# Patient Record
Sex: Female | Born: 1970 | Race: Black or African American | Hispanic: No | Marital: Married | Smoking: Current every day smoker
Health system: Southern US, Community
[De-identification: ages and names within clinical notes are randomized; demographics above are authoritative.]

## PROBLEM LIST (undated history)

## (undated) DIAGNOSIS — G4730 Sleep apnea, unspecified: Secondary | ICD-10-CM

## (undated) DIAGNOSIS — I10 Essential (primary) hypertension: Secondary | ICD-10-CM

## (undated) DIAGNOSIS — E785 Hyperlipidemia, unspecified: Secondary | ICD-10-CM

## (undated) DIAGNOSIS — Z8489 Family history of other specified conditions: Secondary | ICD-10-CM

## (undated) HISTORY — DX: Essential (primary) hypertension: I10

## (undated) HISTORY — DX: Hyperlipidemia, unspecified: E78.5

## (undated) HISTORY — PX: OTHER SURGICAL HISTORY: SHX169

## (undated) HISTORY — PX: TUBAL LIGATION: SHX77

---
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
## 2008-09-07 LAB — CONVERTED CEMR LAB: Pap Smear: NORMAL

## 2010-01-15 ENCOUNTER — Ambulatory Visit: Payer: Self-pay | Admitting: Family Medicine

## 2010-01-15 DIAGNOSIS — I10 Essential (primary) hypertension: Secondary | ICD-10-CM | POA: Insufficient documentation

## 2010-01-16 LAB — CONVERTED CEMR LAB
ALT: 10 units/L (ref 0–35)
Albumin: 4.1 g/dL (ref 3.5–5.2)
CO2: 22 meq/L (ref 19–32)
Calcium: 9 mg/dL (ref 8.4–10.5)
Chloride: 108 meq/L (ref 96–112)
Cholesterol: 171 mg/dL (ref 0–200)
Glucose, Bld: 92 mg/dL (ref 70–99)
Sodium: 139 meq/L (ref 135–145)
Total Bilirubin: 0.6 mg/dL (ref 0.3–1.2)
Total Protein: 6.2 g/dL (ref 6.0–8.3)
Triglycerides: 103 mg/dL (ref <150)

## 2010-01-19 ENCOUNTER — Encounter: Payer: Self-pay | Admitting: Family Medicine

## 2010-01-27 ENCOUNTER — Ambulatory Visit: Payer: Self-pay | Admitting: Family Medicine

## 2010-05-15 ENCOUNTER — Encounter: Payer: Self-pay | Admitting: Emergency Medicine

## 2010-05-15 ENCOUNTER — Ambulatory Visit
Admission: RE | Admit: 2010-05-15 | Discharge: 2010-05-15 | Payer: Self-pay | Source: Home / Self Care | Admitting: Emergency Medicine

## 2010-05-15 DIAGNOSIS — I10 Essential (primary) hypertension: Secondary | ICD-10-CM | POA: Insufficient documentation

## 2010-05-19 ENCOUNTER — Telehealth (INDEPENDENT_AMBULATORY_CARE_PROVIDER_SITE_OTHER): Payer: Self-pay | Admitting: *Deleted

## 2010-05-25 ENCOUNTER — Ambulatory Visit
Admission: RE | Admit: 2010-05-25 | Discharge: 2010-05-25 | Payer: Self-pay | Source: Home / Self Care | Attending: Family Medicine | Admitting: Family Medicine

## 2010-06-09 NOTE — Assessment & Plan Note (Signed)
Summary: check BP  Nurse Visit   Vital Signs:  Patient profile:   40 year old female Pulse rate:   70 / minute BP sitting:   137 / 88  (right arm) Cuff size:   large  History of Present Illness: BP check. BP was high at last visit.  Pt has form that needs to be mailed to [facility]. Tolerating medication well.    Impression & Recommendations:  Problem # 1:  HYPERTENSION, BENIGN (ICD-401.1) Much better. F/U in 3 months. Will send forms to the [facility].   Her updated medication list for this problem includes:    Lisinopril-hydrochlorothiazide 20-12.5 Mg Tabs (Lisinopril-hydrochlorothiazide) ..... One tablet by mouth once a day  Complete Medication List: 1)  Lisinopril-hydrochlorothiazide 20-12.5 Mg Tabs (Lisinopril-hydrochlorothiazide) .... One tablet by mouth once a day   Allergies: No Known Drug Allergies  Orders Added: 1)  Est. Patient Level I [[address]]

## 2010-06-09 NOTE — Letter (Signed)
Summary: Medical Report Form/NCDMV  Medical Report Form/NCDMV   Imported By: Lanelle Bal 02/03/2010 11:52:42  _____________________________________________________________________  External Attachment:    Type:   Image     Comment:   External Document

## 2010-06-09 NOTE — Letter (Signed)
Summary: Generic Letter  [facility]  [address], [address]   [place], [place] [address]   Phone: [phone]  Fax: [phone]    01/19/2010  Kimberly Gamble [address] [place], [place]  [address]  Dear Ms. Hippler,    We have received your lab results back and have been unable to reach you. Labs look good. Your LDL (bad) cholesterol is up just a little. Work on weight loss, low fat diet and exercise and recheck in 1 year.       Sincerely,   Nani Gasser, MD

## 2010-06-09 NOTE — Assessment & Plan Note (Signed)
Summary: NOV: Paper for DOT, HTN   Vital Signs:  Patient profile:   40 year old female Height:      64 inches Weight:      271 pounds BMI:     46.69 Pulse rate:   106 / minute BP sitting:   162 / 100  (right arm) Cuff size:   large  Vitals Entered By: Avon Gully CMA, Duncan Dull) (January 15, 2010 10:36 AM) CC: NP-est care   CC:  NP-est care.  History of Present Illness: Dx with HTN in March or April. 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 Denies any other cardio, pulmonary problems etc.  She is sending records from her Urgent Care which is where she has been seen for the last year.   Habits & Providers  Alcohol-Tobacco-Diet     Alcohol drinks/day: 0     Tobacco Status: current     Cigarette Packs/Day: 1.0  Exercise-Depression-Behavior     Does Patient Exercise: yes     Type of exercise: walking     STD Risk: past     Drug Use: no     Seat Belt Use: always  Current Medications (verified): 1)  Lisinopril-Hydrochlorothiazide 20-12.5 Mg Tabs (Lisinopril-Hydrochlorothiazide) .... One Tablet By Mouth Once A Day  Allergies (verified): No Known Drug Allergies  Comments:  Nurse/Medical Assistant: The patient's medications and allergies were reviewed with the patient and were updated in the Medication and Allergy Lists. 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  [place]. Livew with her partner Gillian Shields adn 3 children. She has 4 children total.  Current Smoker Alcohol use-no Drug use-no Regular exercise-yes Smoking Status:  current Packs/Day:  1.0 Does Patient Exercise:  yes STD Risk:  past Drug Use:  no Seat Belt Use:  always  Review of Systems       No  fever/sweats/weakness, unexplained weight loss/gain.  No vison changes.  No difficulty hearing/ringing in ears, hay fever/allergies.  No chest pain/discomfort, palpitations.  No Br lump/nipple discharge.  No cough/wheeze.  No blood in BM, nausea/vomiting/diarrhea.  No nighttime urination, leaking urine, unusual vaginal bleeding, discharge (penis or vagina).  No muscle/joint pain. No rash, change in mole.  No HA, memory loss.  No anxiety, sleep d/o, depression.  No easy bruising/bleeding, unexplained lump   Physical Exam  General:  Well-developed,well-nourished,in no acute distress; alert,appropriate and cooperative throughout examination Head:  Normocephalic and atraumatic without obvious abnormalities. No apparent alopecia or balding. Eyes:  No corneal or conjunctival inflammation noted. EOMI. Perrla.  Ears:  External ear exam shows no significant lesions or deformities.  Otoscopic examination reveals clear canals, tympanic membranes are intact bilaterally without bulging, retraction, inflammation or discharge. Hearing is grossly normal bilaterally. Nose:  External nasal examination shows no deformity or inflammation. Nasal mucosa are pink and moist without lesions or exudates. Mouth:  Oral mucosa and oropharynx without lesions or exudates.  Teeth in good repair. Neck:  No deformities, masses, or tenderness noted. Lungs:  Normal respiratory effort, chest expands symmetrically. Lungs are clear to auscultation, no crackles or wheezes. Heart:  Normal rate and regular rhythm. S1 and S2 normal without gallop, murmur, click, rub or other extra sounds.No carotid  or abdominal bruits.  Abdomen:  Bowel sounds positive,abdomen soft and non-tender without masses, organomegaly or hernias noted. Msk:  No deformity or scoliosis noted of thoracic or lumbar spine.   Pulses:  R and L carotid,radial,dorsalis pedis and posterior tibial pulses are full and equal bilaterally Extremities:  No clubbing, cyanosis, edema, or  deformity noted with normal full range of motion of all joints.   Neurologic:  No cranial nerve deficits noted. Station and gait are normal.  DTRs are symmetrical throughout. Sensory, motor and coordinative functions appear intact. Skin:  no rashes.   Cervical Nodes:  No lymphadenopathy noted Psych:  Cognition and judgment appear intact. Alert and cooperative with normal attention span and concentration. No apparent delusions, illusions, hallucinations   Contraindications/Deferment of Procedures/Staging:    Test/Procedure: FLU VAX    Reason for deferment: patient declined   Impression & Recommendations:  Problem # 1:  HYPERTENSION, BENIGN (ICD-401.1) She is over due for labs. Will get that today. Restart medication and can f/u next week for BP check with the nurse. Will get records and then complete form for the DOT. SHe has had her DO already fill out the vision section. If BP is normal then complete her form.  Her updated medication list for this problem includes:    Lisinopril-hydrochlorothiazide 20-12.5 Mg Tabs (Lisinopril-hydrochlorothiazide) ..... One tablet by mouth once a day  Orders: T-Comprehensive Metabolic Panel [id number]) T-Lipid Profile ([id number]) T-TSH [id number])  Complete Medication List: 1)  Lisinopril-hydrochlorothiazide 20-12.5 Mg Tabs (Lisinopril-hydrochlorothiazide) .... One tablet by mouth once a day  Patient Instructions: 1)  Nurse appt next week to recheck blood pressure 2)  We will call you with your lab results  3)  Please schedule a follow-up appointment in 3-4 months for blood pressure .  Prescriptions: LISINOPRIL-HYDROCHLOROTHIAZIDE 20-12.5 MG TABS (LISINOPRIL-HYDROCHLOROTHIAZIDE) one tablet by mouth once a day  #30 x 3   Entered and Authorized by:   Nani Gasser MD   Signed by:   Nani Gasser MD on 01/15/2010   Method used:   Electronically to        [facility]  [address] (retail)       [address]       [place],  [place]  [address]       Ph: [phone]       Fax: [phone]   RxID:   [id number]   TD Result Date:  11/07/2000 PAP Result Date:  09/07/2008 PAP Result:  normal    Immunization History:  Tetanus/Td Immunization History:    Tetanus/Td:  td (11/07/2000)

## 2010-06-10 ENCOUNTER — Ambulatory Visit: Admit: 2010-06-10 | Payer: Self-pay | Admitting: Cardiology

## 2010-06-10 ENCOUNTER — Ambulatory Visit: Payer: Self-pay | Admitting: Cardiology

## 2010-06-11 NOTE — Assessment & Plan Note (Signed)
Summary: Atypical CP   Vital Signs:  Patient profile:   40 year old female Height:      64 inches Weight:      262 pounds Pulse rate:   73 / minute BP sitting:   132 / 86  (right arm) Cuff size:   large  Vitals Entered By: Avon Gully CMA, Duncan Dull) (May 25, 2010 9:34 AM) CC: f/u chest pain from urgent care visit   CC:  f/u chest pain from urgent care visit.  History of Present Illness: f/u chest pain from urgent care visit. Was haveing chest pain and left arm pain. Pain would startin the mid left chest over the breast and then radiate into her shoulder and down into her arm at times. Seh has not had it radiate into her arm since she went to the ED.  Not worse with motion or  exertion. Thought initally it was heartburn so went to the pharmacy and tried some alkeseltzer.  Pain was relieved with alkeselter. Felt better laying flat, worse sitting up.  Later that day felt worse again and tried TUMS and got some relief shortly. Some mild constipation initially but that has resolved.  Went back to the pharmacy adn started omeprazole and each day felt a little better.  Now still hurting at night and affecting her sleep. Taking IBU and that seems to help the pain for a short period.  Did take the PPI for 14 days. Some belching.  No pain iwth the shoulder or neck with ROM. Says her bowels are moving more normally. Has cut out all soda and caffeine.    NO SOB or diaphoresis.    Current Medications (verified): 1)  Lisinopril-Hydrochlorothiazide 20-12.5 Mg Tabs (Lisinopril-Hydrochlorothiazide) .... One Tablet By Mouth Once A Day  Allergies (verified): No Known Drug Allergies  Comments:  Nurse/Medical Assistant: The patient's medications and allergies were reviewed with the patient and were updated in the Medication and Allergy Lists. Avon Gully CMA, Duncan Dull) (May 25, 2010 9:34 AM)  Family History: Reviewed history from 01/15/2010 and no changes required. 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 Lives with her partner Gillian Shields adn 3 children. She has 4 children total.  Current Smoker 1/2 PPD x 15+ years Alcohol use-no Drug use-no Regular exercise-yes  Physical Exam  General:  Well-developed,well-nourished,in no acute distress; alert,appropriate and cooperative throughout examination Head:  Normocephalic and atraumatic without obvious abnormalities. No apparent alopecia or balding. Eyes:  No corneal or conjunctival inflammation noted. EOMI. Perrla. Neck:  No deformities, masses, or tenderness noted. NO TM.  Lungs:  Normal respiratory effort, chest expands symmetrically. Lungs are clear to auscultation, no crackles or wheezes. Heart:  Normal rate and regular rhythm. S1 and S2 normal without gallop, murmur, click, rub or other extra sounds. Abdomen:  Bowel sounds positive,abdomen soft and non-tender without masses, organomegaly or hernias noted. Pulses:  Radial 2+  Extremities:  NO LE edema.  Skin:  no rashes.   Cervical Nodes:  No lymphadenopathy noted Psych:  Cognition and judgment appear intact. Alert and cooperative with normal attention span and concentration. No apparent delusions, illusions, hallucinations   Impression & Recommendations:  Problem # 1:  CHEST PAIN, ATYPICAL (ICD-786.59) I reviewed the note from UC. She is obese, smoker,  hx of HTN (though well controlled) and some family hx of heart disease. I would like her to see cards to see if they recommend stresss test.  Also very likely is GERD, since she has some improvement in her sxs with a PPI I gave her samples of Nexium to try today. Also reviewed when to correctly take the medication. She has already cut out soda which is fantastic.  Cholesterol is OK.  Orders: Cardiology Referral (Cardiology)  Complete Medication List: 1)  Lisinopril-hydrochlorothiazide 20-12.5 Mg Tabs (Lisinopril-hydrochlorothiazide) .... One tablet  by mouth once a day  Patient Instructions: 1)  Try the samples once a day about 20 minutes before breakfast.  2)  Call me if you suddenly get worse.  Prescriptions: LISINOPRIL-HYDROCHLOROTHIAZIDE 20-12.5 MG TABS (LISINOPRIL-HYDROCHLOROTHIAZIDE) one tablet by mouth once a day  #90 x 2   Entered and Authorized by:   Nani Gasser MD   Signed by:   Nani Gasser MD on 05/25/2010   Method used:   Electronically to        [facility]  [address] (retail)       [address]       [place], [place]  [address]       Ph: [phone]       Fax: [phone]   RxID:   [id number]    Orders Added: 1)  Cardiology Referral [Cardiology] 2)  Est. Patient Level IV [[address]]

## 2010-06-11 NOTE — Assessment & Plan Note (Signed)
Summary: CHEST PAIN, PAIN IN LT ARM/WSE   Vital Signs:  Patient Profile:   40 Years Old Female CC:      chest tightness x 10 days, left arm pain x today Height:     64 inches Weight:      191.01 pounds O2 Sat:      97 % O2 treatment:    Room Air Temp:     97.4 degrees F oral Pulse rate:   83 / minute Resp:     16 per minute BP sitting:   137 / 87  (left arm) Cuff size:   large  Pt. in pain?   yes    Location:   chest    Intensity:   5    Type:       tight  Vitals Entered By: Lajean Saver RN (May 15, 2010 7:17 PM)                   Updated Prior Medication List: LISINOPRIL-HYDROCHLOROTHIAZIDE 20-12.5 MG TABS (LISINOPRIL-HYDROCHLOROTHIAZIDE) one tablet by mouth once a day  Current Allergies: No known allergies History of Present Illness History from: patient Chief Complaint: chest tightness x 10 days, left arm pain x today History of Present Illness: Chest pain for 11 days, constant and not positional and not with exertion.  Mild L arm pain as well but feels like shooting nerve pain.  She is a smoker and with HTN and obese.  Both parents are alive and have cardiac problems.  She went to talk to a pharmacist who got her started on Mylanta and a PPI.  After starting it a few days ago, she is feeling much better.  However she is here because she is concerned with her heart.  She has a follow up appt with her PCP in 2-3 weeks to discuss labwork.  No nausea, SOB, fatigue.  She is a school bus driver.  No anxiety or stress recently.  REVIEW OF SYSTEMS Constitutional Symptoms      Denies fever, chills, night sweats, weight loss, weight gain, and fatigue.  Eyes       Denies change in vision, eye pain, eye discharge, glasses, contact lenses, and eye surgery. Ear/Nose/Throat/Mouth       Denies hearing loss/aids, change in hearing, ear pain, ear discharge, dizziness, frequent runny nose, frequent nose bleeds, sinus problems, sore throat, hoarseness, and tooth pain or bleeding.    Respiratory       Denies dry cough, productive cough, wheezing, shortness of breath, asthma, bronchitis, and emphysema/COPD.  Cardiovascular       Complains of chest pain.      Denies murmurs and tires easily with exhertion.      Comments: tight   Gastrointestinal       Denies stomach pain, nausea/vomiting, diarrhea, constipation, blood in bowel movements, and indigestion. Genitourniary       Denies painful urination, kidney stones, and loss of urinary control. Neurological       Denies paralysis, seizures, and fainting/blackouts. Musculoskeletal       Denies muscle pain, joint pain, joint stiffness, decreased range of motion, redness, swelling, muscle weakness, and gout.  Skin       Denies bruising, unusual mles/lumps or sores, and hair/skin or nail changes.  Psych       Denies mood changes, temper/anger issues, anxiety/stress, speech problems, depression, and sleep problems. Other Comments: Patient c/o chest pain x 11 days. She describes it as tight and constant. She thought it was  gas. She has taken antacids, mylanta, and protonix OTC. pain still persists. Today she started to have pain shoot down her left arm She does not appear to be in any acute distress. No SOB or diaphoresis.    Past History:  Past Medical History:  Hypertension  Past Surgical History: Tubal ligation Caesarean section  Family History: Reviewed history from 01/15/2010 and no changes required. 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  [place]. Livew with her partner Gillian Shields adn 3 children. She has 4 children total.  Current Smoker 1/2 PPD x 15+ years Alcohol use-no Drug use-no Regular exercise-yes Physical Exam General appearance: well developed, obese, no acute distress and appears very comfortable with no obvious discomfort Ears: normal, no lesions or deformities Nasal: mucosa pink, nonedematous, no septal deviation, turbinates  normal Oral/Pharynx: tongue normal, posterior pharynx without erythema or exudate Thyroid: no nodules, masses, tenderness, or enlargement Chest/Lungs: no rales, wheezes, or rhonchi bilateral, breath sounds equal without effot Heart: regular rate and  rhythm, no murmur Abdomen: no AAA felt, no renal bruits heard Skin: no obvious rashes or lesions MSE: oriented to time, place, and person Assessment New Problems: CHEST PAIN, ACUTE (ICD-786.50) HYPERTENSION (ICD-401.9)   Plan New Orders: New Patient Level III [[address]] EKG w/ Interpretation [93000] Planning Comments:   This is atypical chest pain and likely is not cardiac in origin (especially with the constant nature and much improved with GERD meds).  Patient prefers not to go to the ER so we will do our best to set up follow up outpatient unless she is getting worse.  However she does have a few RF (weight, smoking, and HTN).  I would therefore like to set her up with a cardiologist appt within a few days and to have her follow up with her PCP (Dr. Linford Arnold) on Monday to ensure that she is continuing to feel better.  I would suspect that the cardiologist would do a stress test + Echo.  The EKG is normal except for L atrial enlargement which may indicate MVP.  I would like her to stop smoking and eat healthy to improve her weight and blood pressure.  I have counseled the patient that her EKG currently looks ok but I can't promise that this is not her heart, so if she has any worsening of symptoms (CP, SOB, sweating, nausea, etc) to call EMS or go directly to the ER.  I gave her a copy of her EKG to carry in her purse.  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   The patient and/or caregiver has been counseled thoroughly with regard to medications prescribed including dosage, schedule, interactions, rationale for use, and possible side effects and they verbalize understanding.  Diagnoses  and expected course of recovery discussed and will return if not improved as expected or if the condition worsens. Patient and/or caregiver verbalized understanding.   Orders Added: 1)  New Patient Level III [[address]] 2)  EKG w/ Interpretation [93000]

## 2010-06-11 NOTE — Progress Notes (Signed)
  Phone Note Outgoing Call Call back at [facility]   Call placed by: Lajean Saver RN,  May 19, 2010 10:54 AM Call placed to: Selena Batten  Action Taken: Phone Call Completed, Information Sent Reason for Call: Get patient information Summary of Call: Called [facility] to schedule an appointment for Kimberly Gamble to see Dr. Jens Som this Wednesday in [place]. I have her the patient's information. Selena Batten is to schedule he appointment and call the patient.     Call placed on 05/18/10 @ 2 pm

## 2010-06-24 ENCOUNTER — Encounter: Payer: Self-pay | Admitting: Cardiology

## 2010-06-24 ENCOUNTER — Ambulatory Visit (INDEPENDENT_AMBULATORY_CARE_PROVIDER_SITE_OTHER): Payer: Federal, State, Local not specified - PPO | Admitting: Cardiology

## 2010-06-24 DIAGNOSIS — F17200 Nicotine dependence, unspecified, uncomplicated: Secondary | ICD-10-CM | POA: Insufficient documentation

## 2010-06-24 DIAGNOSIS — I10 Essential (primary) hypertension: Secondary | ICD-10-CM

## 2010-06-24 DIAGNOSIS — R072 Precordial pain: Secondary | ICD-10-CM

## 2010-07-01 NOTE — Assessment & Plan Note (Signed)
Summary: [facility]   Visit Type:  Initial Consult   History of Present Illness: 40 year old female with no prior cardiac history for evaluation of chest pain. Patient states that for the past 2 months she has had intermittent chest pain. He is in the left upper chest and radiates to her shoulder and arm. It is described as an aching sensation. There is no associated nausea, shortness of breath or diaphoresis. It begins in the mornings and can last all day. She initially was given omeprazole with some improvement. She also has taken ibuprofen with some improvement. When the pain initially began it increased with sitting up. The pain is not pleuritic, exertional or related to food. She has pain on a daily basis. She does not have exertional chest pain, dyspnea on exertion, orthopnea, PND, pedal edema or history of syncope. Because of her chest pain we were asked to further evaluate.  Problems Prior to Update: 1)  Chest Pain, Atypical  (ICD-786.59) 2)  Hypertension  (ICD-401.9) 3)  Hypertension, Benign  (ICD-401.1)  Current Medications (verified): 1)  Lisinopril-Hydrochlorothiazide 20-12.5 Mg Tabs (Lisinopril-Hydrochlorothiazide) .... 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  Single. Lives with her partner Gillian Shields and 3 children. She has 4 children total.  Current Smoker 1/2 PPD x 15+ years Alcohol use-no Drug use-no Regular exercise-yes  Risk Factors: Alcohol Use: 0 (01/15/2010) Exercise: yes (01/15/2010)  Risk Factors: Smoking Status: current (01/15/2010) Packs/Day: 1.0 (01/15/2010)  Past Medical History: HYPERTENSION, BENIGN Mild hyperlipidemia  Past Surgical History: Reviewed history from 05/15/2010 and  no changes required. Tubal ligation Caesarean section  Family History: Reviewed history from 01/15/2010 and no changes required. GF wtih MI GF with stroke No premature CAD in immediate family  Social History: Reviewed history from 05/25/2010 and no changes required. 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  Single. Lives with her partner Gillian Shields and 3 children. She has 4 children total.  Current Smoker 1/2 PPD x 15+ years Alcohol use-no Drug use-no Regular exercise-yes  Review of Systems       no fevers or chills, productive cough, hemoptysis, dysphasia, odynophagia, melena, hematochezia, dysuria, hematuria, rash, seizure activity, orthopnea, PND, pedal edema, claudication. Remaining systems are negative.   Vital Signs:  Patient profile:   40 year old female Height:      64 inches Weight:      259 pounds Pulse rate:   87 / minute Pulse rhythm:   regular Resp:     18 per minute BP sitting:   116 / 78  (left arm) Cuff size:   large  Vitals Entered By: Vikki Ports (June 24, 2010 3:52 PM)  Physical Exam  General:  Well developed/obese in NAD Skin warm/dry Patient not depressed No peripheral clubbing Back-normal HEENT-normal/normal eyelids Neck supple/normal carotid upstroke bilaterally; no bruits; no JVD; no thyromegaly chest - CTA/ normal expansion CV - RRR/normal S1 and S2; no murmurs, rubs or gallops;  PMI nondisplaced Abdomen -NT/ND, no HSM, no mass, + bowel sounds, no bruit 2+ femoral pulses, no bruits Ext-no edema, chords, 2+ DP Neuro-grossly nonfocal     EKG  Procedure date:  05/15/2010  Findings:      Sinus rhythm with no ST changes.  EKG  Procedure date:  06/24/2010  Findings:      Sinus rhythm at a rate of 72. Left axis deviation. No ST changes.  Impression & Recommendations:  Problem # 1:  CHEST PAIN, ATYPICAL (ICD-786.59) Symptoms extremely atypical and not consistent with ischemia. Electrocardiogram normal. I do  not think further cardiac testing is warranted. Her symptoms did initially improve with omeprazole. She may require a GI evaluation in the future. Note she has also had some improvement with ibuprofen. She will followup with her primary care physician for further management. Her updated medication list for this problem includes:    Lisinopril-hydrochlorothiazide 20-12.5 Mg Tabs (Lisinopril-hydrochlorothiazide) ..... One tablet by mouth once a day  Problem # 2:  TOBACCO ABUSE (ICD-305.1) Patient counseled on discontinuing.  Problem # 3:  HYPERTENSION (ICD-401.9) Blood pressure controlled on present medications. Will continue. Potassium and renal function monitored by primary care. Her updated medication list for this problem includes:    Lisinopril-hydrochlorothiazide 20-12.5 Mg Tabs (Lisinopril-hydrochlorothiazide) ..... One tablet by mouth once a day

## 2010-07-22 ENCOUNTER — Ambulatory Visit: Payer: Self-pay | Admitting: Cardiology

## 2010-12-23 ENCOUNTER — Encounter: Payer: Self-pay | Admitting: Cardiology

## 2011-03-11 ENCOUNTER — Other Ambulatory Visit: Payer: Self-pay | Admitting: Family Medicine

## 2011-03-11 NOTE — Telephone Encounter (Signed)
Pain is overdue for CPE and or appt to evaluate BP in our office.  Has not been seen in over a year.  Pt must make an appt before any more meds refills will be sent.

## 2011-03-13 ENCOUNTER — Other Ambulatory Visit: Payer: Self-pay | Admitting: Family Medicine

## 2011-04-09 ENCOUNTER — Encounter: Payer: Self-pay | Admitting: Family Medicine

## 2011-04-15 ENCOUNTER — Encounter: Payer: Self-pay | Admitting: Family Medicine

## 2011-04-15 ENCOUNTER — Ambulatory Visit (INDEPENDENT_AMBULATORY_CARE_PROVIDER_SITE_OTHER): Payer: Federal, State, Local not specified - PPO | Admitting: Family Medicine

## 2011-04-15 VITALS — BP 126/70 | HR 86 | Wt 265.0 lb

## 2011-04-15 DIAGNOSIS — F17200 Nicotine dependence, unspecified, uncomplicated: Secondary | ICD-10-CM

## 2011-04-15 DIAGNOSIS — Z1231 Encounter for screening mammogram for malignant neoplasm of breast: Secondary | ICD-10-CM

## 2011-04-15 DIAGNOSIS — Z23 Encounter for immunization: Secondary | ICD-10-CM

## 2011-04-15 DIAGNOSIS — Z720 Tobacco use: Secondary | ICD-10-CM

## 2011-04-15 DIAGNOSIS — N760 Acute vaginitis: Secondary | ICD-10-CM

## 2011-04-15 DIAGNOSIS — G5600 Carpal tunnel syndrome, unspecified upper limb: Secondary | ICD-10-CM

## 2011-04-15 DIAGNOSIS — I10 Essential (primary) hypertension: Secondary | ICD-10-CM

## 2011-04-15 DIAGNOSIS — Q828 Other specified congenital malformations of skin: Secondary | ICD-10-CM

## 2011-04-15 MED ORDER — FLUCONAZOLE 150 MG PO TABS: 150.0000 mg | ORAL_TABLET | Freq: Once | ORAL | Status: AC

## 2011-04-15 MED ORDER — LISINOPRIL-HYDROCHLOROTHIAZIDE 20-12.5 MG PO TABS: 1.0000 | ORAL_TABLET | Freq: Every day | ORAL | Status: DC

## 2011-04-15 NOTE — Patient Instructions (Signed)
Recommend QUIT program on [facility] website in addition to the medication  Smoking Cessation, Tips for Success YOU CAN QUIT SMOKING If you are ready to quit smoking, congratulations! You have chosen to help yourself be healthier. Cigarettes bring nicotine, tar, carbon monoxide, and other irritants into your body. Your lungs, heart, and blood vessels will be able to work better without these poisons. There are many different ways to quit smoking. Nicotine gum, nicotine patches, a nicotine inhaler, or nicotine nasal spray can help with physical craving. Hypnosis, support groups, and medicines help break the habit of smoking. Here are some tips to help you quit for good.  Throw away all cigarettes.     Clean and remove all ashtrays from your home, work, and car.     On a card, write down your reasons for quitting. Carry the card with you and read it when you get the urge to smoke.     Cleanse your body of nicotine. Drink enough water and fluids to keep your urine clear or pale yellow. Do this after quitting to flush the nicotine from your body.     Learn to predict your moods. Do not let a bad situation be your excuse to have a cigarette. Some situations in your life might tempt you into wanting a cigarette.     Never have "just one" cigarette. It leads to wanting another and another. Remind yourself of your decision to quit.     Change habits associated with smoking. If you smoked while driving or when feeling stressed, try other activities to replace smoking. Stand up when drinking your coffee. Brush your teeth after eating. Sit in a different chair when you read the paper. Avoid alcohol while trying to quit, and try to drink fewer caffeinated beverages. Alcohol and caffeine may urge you to smoke.     Avoid foods and drinks that can trigger a desire to smoke, such as sugary or spicy foods and alcohol.     Ask people who smoke not to smoke around you.     Have something planned to do right after  eating or having a cup of coffee. Take a walk or exercise to perk you up. This will help to keep you from overeating.     Try a relaxation exercise to calm you down and decrease your stress. Remember, you may be tense and nervous for the first 2 weeks after you quit, but this will pass.     Find new activities to keep your hands busy. Play with a pen, coin, or rubber band. Doodle or draw things on paper.     Brush your teeth right after eating. This will help cut down on the craving for the taste of tobacco after meals. You can try mouthwash, too.     Use oral substitutes, such as lemon drops, carrots, a cinnamon stick, or chewing gum, in place of cigarettes. Keep them handy so they are available when you have the urge to smoke.     When you have the urge to smoke, try deep breathing.     Designate your home as a nonsmoking area.     If you are a heavy smoker, ask your caregiver about a prescription for nicotine chewing gum. It can ease your withdrawal from nicotine.     Reward yourself. Set aside the cigarette money you save and buy yourself something nice.     Look for support from others. Join a support group or smoking cessation program. Ask  someone at home or at work to help you with your plan to quit smoking.     Always ask yourself, "Do I need this cigarette or is this just a reflex?" Tell yourself, "Today, I choose not to smoke," or "I do not want to smoke." You are reminding yourself of your decision to quit, even if you do smoke a cigarette.  HOW WILL I FEEL WHEN I QUIT SMOKING?  The benefits of not smoking start within days of quitting.     You may have symptoms of withdrawal because your body is used to nicotine (the addictive substance in cigarettes). You may crave cigarettes, be irritable, feel very hungry, cough often, get headaches, or have difficulty concentrating.     The withdrawal symptoms are only temporary. They are strongest when you first quit but will go away  within 10 to 14 days.     When withdrawal symptoms occur, stay in control. Think about your reasons for quitting. Remind yourself that these are signs that your body is healing and getting used to being without cigarettes.     Remember that withdrawal symptoms are easier to treat than the major diseases that smoking can cause.     Even after the withdrawal is over, expect periodic urges to smoke. However, these cravings are generally short-lived and will go away whether you smoke or not. Do not smoke!     If you relapse and smoke again, do not lose hope. Most smokers quit 3 times before they are successful.     If you relapse, do not give up! Plan ahead and think about what you will do the next time you get the urge to smoke.  LIFE AS A NONSMOKER: MAKE IT FOR A MONTH, MAKE IT FOR LIFE Day 1: Hang this page where you will see it every day. Day 2: Get rid of all ashtrays, matches, and lighters. Day 3: Drink water. Breathe deeply between sips. Day 4: Avoid places with smoke-filled air, such as bars, clubs, or the smoking section of restaurants. Day 5: Keep track of how much money you save by not smoking. Day 6: Avoid boredom. Keep a good book with you or go to the movies. Day 7: Reward yourself! One week without smoking! Day 8: Make a dental appointment to get your teeth cleaned. Day 9: Decide how you will turn down a cigarette before it is offered to you. Day 10: Review your reasons for quitting. Day 11: Distract yourself. Stay active to keep your mind off smoking and to relieve tension. Take a walk, exercise, read a book, do a crossword puzzle, or try a new hobby. Day 12: Exercise. Get off the bus before your stop or use stairs instead of escalators. Day 13: Call on friends for support and encouragement. Day 14: Reward yourself! Two weeks without smoking! Day 15: Practice deep breathing exercises. Day 16: Bet a friend that you can stay a nonsmoker. Day 17: Ask to sit in nonsmoking sections  of restaurants. Day 18: Hang up "No Smoking" signs. Day 19: Think of yourself as a nonsmoker. Day 20: Each morning, tell yourself you will not smoke. Day 21: Reward yourself! Three weeks without smoking! Day 22: Think of smoking in negative ways. Remember how it stains your teeth, gives you bad breath, and leaves you short of breath. Day 23: Eat a nutritious breakfast. Day 24:Do not relive your days as a smoker. Day 25: Hold a pencil in your hand when talking on the telephone. Day  26: Tell all your friends you do not smoke. Day 27: Think about how much better food tastes. Day 28: Remember, one cigarette is one too many. Day 29: Take up a hobby that will keep your hands busy. Day 30: Congratulations! One month without smoking! Give yourself a big reward. Your caregiver can direct you to community resources or hospitals for support, which may include:  Group support.     Education.    Hypnosis.    Subliminal therapy.  Document Released: 01/23/2004 Document Revised: 01/06/2011 Document Reviewed: 02/10/2009 [facility] Patient Information 2012 [place], [place].

## 2011-04-15 NOTE — Progress Notes (Signed)
  Subjective:    Patient ID: Kimberly Gamble, female    DOB: 09-Mar-1971, 40 y.o.   MRN: [id number]  Hypertension This is a chronic problem. The current episode started more than 1 year ago. Pertinent negatives include no chest pain, peripheral edema or shortness of breath. There are no associated agents to hypertension. Past treatments include ACE inhibitors and diuretics. The current treatment provides moderate improvement. There are no compliance problems.    Hands have been going numb at night since May.  No longer having arm pain.  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 Thinks she may have carpel tunnel.    Quesiont about skin tags.   She is on amoxicillin for tooth infection. She now thinks has a yeast infection.   She is interested in quitting smoking. Longest quit was 24 hour.  She is smoking 1/2 ppd.  She has read about Chantix and is interested.   Review of Systems  Respiratory: Negative for shortness of breath.   Cardiovascular: Negative for chest pain.       Objective:   Physical Exam  Constitutional: She is oriented to person, place, and time. She appears well-developed and well-nourished.  HENT:  Head: Normocephalic and atraumatic.  Cardiovascular: Normal rate, regular rhythm and normal heart sounds.        No carotid or abdominal bruits.   Pulmonary/Chest: Effort normal and breath sounds normal.  Neurological: She is alert and oriented to person, place, and time.  Skin: Skin is warm and dry.  Psychiatric: She has a normal mood and affect. Her behavior is normal.          Assessment & Plan:  HTN- Well controlled. RF meds. R/U in 6 months.  Due for CMP and lipids. Will call with results  Tob Abuse - We discussed smoking cessation. Discussed risk and benefits of Chantix. Take with food and water. Recommend fll 3 months uusage. Stop immediately if change in mood or nightmares. Recommend use in conjunction with online QUIT program to be more successful    Likely yeast vaginitis  W/ recent ABX exposure- Will tx with diflucan. Call if sxs don't resolve.   Given Tdap abd flu shots.   Hand Pain and numbness -I suspect carpel tunnel based on her description. Trial of wrist splints at bedtime.

## 2011-04-16 LAB — COMPLETE METABOLIC PANEL WITH GFR
Albumin: 4.2 g/dL (ref 3.5–5.2)
Alkaline Phosphatase: 53 U/L (ref 39–117)
BUN: 16 mg/dL (ref 6–23)
CO2: 29 mEq/L (ref 19–32)
Calcium: 9.2 mg/dL (ref 8.4–10.5)
Chloride: 103 mEq/L (ref 96–112)
GFR, Est African American: >89 mL/min
GFR, Est Non African American: >89 mL/min
Glucose, Bld: 86 mg/dL (ref 70–99)
Potassium: 4.1 mEq/L (ref 3.5–5.3)
Sodium: 137 mEq/L (ref 135–145)
Total Protein: 6.7 g/dL (ref 6.0–8.3)

## 2011-04-16 LAB — LIPID PANEL
Cholesterol: 176 mg/dL (ref 0–200)
Total CHOL/HDL Ratio: 3.7 Ratio

## 2012-02-08 ENCOUNTER — Other Ambulatory Visit: Payer: Self-pay | Admitting: Family Medicine

## 2012-04-27 ENCOUNTER — Other Ambulatory Visit: Payer: Self-pay | Admitting: Family Medicine

## 2012-04-27 NOTE — Telephone Encounter (Signed)
Must make appt before any further refills.

## 2012-05-26 ENCOUNTER — Ambulatory Visit (INDEPENDENT_AMBULATORY_CARE_PROVIDER_SITE_OTHER): Payer: Federal, State, Local not specified - PPO | Admitting: Family Medicine

## 2012-05-26 ENCOUNTER — Encounter: Payer: Self-pay | Admitting: Family Medicine

## 2012-05-26 VITALS — BP 121/76 | HR 70 | Ht 65.0 in | Wt 259.0 lb

## 2012-05-26 DIAGNOSIS — Z1231 Encounter for screening mammogram for malignant neoplasm of breast: Secondary | ICD-10-CM

## 2012-05-26 DIAGNOSIS — Z0000 Encounter for general adult medical examination without abnormal findings: Secondary | ICD-10-CM

## 2012-05-26 DIAGNOSIS — I10 Essential (primary) hypertension: Secondary | ICD-10-CM

## 2012-05-26 DIAGNOSIS — B351 Tinea unguium: Secondary | ICD-10-CM

## 2012-05-26 DIAGNOSIS — D259 Leiomyoma of uterus, unspecified: Secondary | ICD-10-CM

## 2012-05-26 MED ORDER — TERBINAFINE HCL 250 MG PO TABS: 250.0000 mg | ORAL_TABLET | Freq: Every day | ORAL | Status: DC

## 2012-05-26 MED ORDER — LISINOPRIL-HYDROCHLOROTHIAZIDE 20-12.5 MG PO TABS: 1.0000 | ORAL_TABLET | Freq: Every day | ORAL | Status: DC

## 2012-05-26 NOTE — Progress Notes (Signed)
Subjective:     Kimberly Gamble is a 42 y.o. female and is here for a comprehensive physical exam. The patient reports no problems. She's doing well overall. She's not any major problems. She would like me to look at her toenails. She thinks she now has a nail fungus after having a pedicure. It is gotten very thick and breaks easily. She still been pain or d/c over a period no pain or discharge around the nail bed.  History   Social History  . Marital Status: Single    Spouse Name: N/A    Number of Children: N/A  . Years of Education: N/A   Occupational History  . Not on file.   Social History Main Topics  . Smoking status: Current Every Day Smoker -- 0.3 packs/day for 15 years    Types: Cigarettes  . Smokeless tobacco: Not on file     Comment: 1/2 ppd x 15+ years.   . Alcohol Use: No  . Drug Use: No  . Sexually Active: Not on file   Other Topics Concern  . Not on file   Social History Narrative   Regularly exercises. 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 GED. In college.    Health Maintenance  Topic Date Due  . Pap Smear  07/16/1988  . Tetanus/tdap  04/14/2021  . Influenza Vaccine  01/09/2012    The following portions of the patient's history were reviewed and updated as appropriate: allergies, current medications, past family history, past medical history, past social history, past surgical history and problem list.  Review of Systems A comprehensive review of systems was negative.   Objective:    BP 121/76  Pulse 70  Ht 5\' 5"  (1.651 m)  Wt 259 lb (117.482 kg)  BMI 43.10 kg/m2  LMP 05/26/2012 General appearance: alert, cooperative and appears stated age Head: Normocephalic, without obvious abnormality, atraumatic Eyes: conj clear, EOMi, PEERLA Ears: normal TM's and external ear canals both ears Nose: Nares normal. Septum midline. Mucosa normal. No drainage or sinus tenderness. Throat: lips, mucosa, and tongue normal; teeth and gums normal Neck: no adenopathy, no  carotid bruit, no JVD, supple, symmetrical, trachea midline and thyroid not enlarged, symmetric, no tenderness/mass/nodules Back: symmetric, no curvature. ROM normal. No CVA tenderness. Lungs: clear to auscultation bilaterally Breasts: normal appearance, no masses or tenderness Heart: regular rate and rhythm, S1, S2 normal, no murmur, click, rub or gallop Abdomen: soft, non-tender; bowel sounds normal; no masses,  no organomegaly Extremities: extremities normal, atraumatic, no cyanosis or edema Pulses: 2+ and symmetric Skin: Skin color, texture, turgor normal. No rashes or lesions Lymph nodes: Cervical, supraclavicular, and axillary nodes normal. Neurologic: Alert and oriented X 3, normal strength and tone. Normal symmetric reflexes. Normal coordination and gait    Assessment:    Healthy female exam.      Plan:     See After Visit Summary for Counseling Recommendations  Keep up a regular exercise program and make sure you are eating a healthy diet Try to eat 4 servings of dairy a day, or if you are lactose intolerant take a calcium with vitamin D daily.  Your vaccines are up to date.   HTN- well controlled.  Continue current regimen. Followup in 6 months. Due for CMP and fasting lipid panel.  Onychomycosis, right great toenail-discussed treatment options. I think it would be best to try oral Lamisil. Explained that I need to make sure that her baseline liver enzymes are normal before she starts the medication. I  did let her that some insurance companies require a nail culture but we will try send medication through initially.  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 She's also due for her Pap smear. Last one was 3 years ago. She says she started her period this morning so can't do it today so I will let GYN do this.  Refer for screening mammogram. She never went last year.

## 2012-05-26 NOTE — Patient Instructions (Addendum)
Keep up a regular exercise program and make sure you are eating a healthy diet Try to eat 4 servings of dairy a day, or if you are lactose intolerant take a calcium with vitamin D daily.  Your vaccines are up to date.   

## 2012-05-29 ENCOUNTER — Telehealth: Payer: Self-pay

## 2012-05-29 NOTE — Telephone Encounter (Signed)
Left message for pt to call our office to set up appointment. Was referred by Dr. Darra Lis

## 2012-05-31 LAB — COMPLETE METABOLIC PANEL WITH GFR
ALT: 9 U/L (ref 0–35)
AST: 15 U/L (ref 0–37)
Alkaline Phosphatase: 65 U/L (ref 39–117)
BUN: 12 mg/dL (ref 6–23)
Chloride: 100 mEq/L (ref 96–112)
Creat: 0.69 mg/dL (ref 0.50–1.10)
Potassium: 3.8 mEq/L (ref 3.5–5.3)

## 2012-05-31 LAB — LIPID PANEL
Cholesterol: 189 mg/dL (ref 0–200)
Triglycerides: 156 mg/dL — ABNORMAL HIGH (ref <150)

## 2012-06-02 ENCOUNTER — Ambulatory Visit: Payer: BC Managed Care – PPO | Admitting: Family Medicine

## 2012-06-05 ENCOUNTER — Encounter: Payer: Self-pay | Admitting: Family Medicine

## 2012-06-05 ENCOUNTER — Other Ambulatory Visit (HOSPITAL_COMMUNITY)
Admission: RE | Admit: 2012-06-05 | Discharge: 2012-06-05 | Disposition: A | Discharge status: Home / Self Care | Payer: BC Managed Care – PPO | Source: Ambulatory Visit | Attending: Family Medicine | Admitting: Family Medicine

## 2012-06-05 ENCOUNTER — Ambulatory Visit (INDEPENDENT_AMBULATORY_CARE_PROVIDER_SITE_OTHER): Payer: BC Managed Care – PPO | Admitting: Family Medicine

## 2012-06-05 VITALS — BP 124/76 | HR 78 | Ht 65.0 in | Wt 265.0 lb

## 2012-06-05 DIAGNOSIS — Z01419 Encounter for gynecological examination (general) (routine) without abnormal findings: Secondary | ICD-10-CM | POA: Insufficient documentation

## 2012-06-05 DIAGNOSIS — M21969 Unspecified acquired deformity of unspecified lower leg: Secondary | ICD-10-CM

## 2012-06-05 DIAGNOSIS — Z1151 Encounter for screening for human papillomavirus (HPV): Secondary | ICD-10-CM | POA: Insufficient documentation

## 2012-06-05 DIAGNOSIS — Z124 Encounter for screening for malignant neoplasm of cervix: Secondary | ICD-10-CM

## 2012-06-05 NOTE — Progress Notes (Signed)
  Subjective:    Patient ID: Kimberly Gamble, female    DOB: 30-Sep-1970, 42 y.o.   MRN: [id number]  HPI She is getting some deformity of her toes, but no pain. She would like a referral.   Pap only - No prior abnormal paps. No abnormal bleeding.     Review of Systems     Objective:   Physical Exam  Constitutional: She appears well-developed and well-nourished.  Genitourinary: Uterus normal.    There is no rash, tenderness, lesion or injury on the right labia. There is no rash, tenderness, lesion or injury on the left labia. Cervix exhibits no motion tenderness, no discharge and no friability. Right adnexum displays no mass, no tenderness and no fullness. Left adnexum displays no mass, no tenderness and no fullness. No erythema or tenderness around the vagina. No foreign body around the vagina. No signs of injury around the vagina. No vaginal discharge found.  Skin: Skin is warm and dry.  Psychiatric: She has a normal mood and affect. Her behavior is normal.          Assessment & Plan:  Foot deformity - fortunately she has no significant pain .Will refer to podiatry for futher evaluation.   Pap smear performed-will contact patient says the results are available. If it is normal repeat in 2-3 years since she has had no recent history of abnormal Pap smears.

## 2012-06-14 ENCOUNTER — Telehealth: Payer: Self-pay | Admitting: Family Medicine

## 2012-06-14 NOTE — Telephone Encounter (Signed)
Call patient and let her note received a letter from her insurance stating that the medication for the toenail fungus was declined. They want to know that she's had a positive culture. TO collect a culture she will need to let her nail grow out slightly so that she can come in and we can clip it and send in a sterile cup for a fungal culture. If positive they will cover the medication. Her other option is to pay for out-of-pocket if she would like.

## 2012-06-14 NOTE — Telephone Encounter (Signed)
Called pt and lmovm informing her that she will need to make an appt due to ins not covering the medication unless she has a pos. Culture or she can pay out of pocket for the meds.Kimberly Gamble [place]

## 2012-07-07 ENCOUNTER — Telehealth: Payer: Self-pay | Admitting: *Deleted

## 2012-07-07 NOTE — Telephone Encounter (Signed)
She can pay cash,  So can call around to [employer] and find out cash price and who is cheaper.

## 2012-07-07 NOTE — Telephone Encounter (Signed)
LMOM notifying pt. 

## 2012-07-07 NOTE — Telephone Encounter (Signed)
Pt calls and states what she need to do in regards to the Lamisil. Her insurance denied this even after doin the prior auth and wants to know what she can do now

## 2012-10-11 ENCOUNTER — Telehealth: Payer: Self-pay | Admitting: *Deleted

## 2012-10-11 NOTE — Telephone Encounter (Signed)
Pt's forms have been faxed to [facility]. I informed pt that Dr. Linford Arnold only completed pages 2 & 4. Pt stated that the rest of her forms have been completed and that they have them already.Laureen Ochs, Viann Shove

## 2012-12-06 ENCOUNTER — Other Ambulatory Visit: Payer: Self-pay | Admitting: Family Medicine

## 2012-12-12 ENCOUNTER — Other Ambulatory Visit: Payer: Self-pay | Admitting: Family Medicine

## 2013-06-27 ENCOUNTER — Encounter: Payer: BC Managed Care – PPO | Admitting: Family Medicine

## 2013-07-01 ENCOUNTER — Other Ambulatory Visit: Payer: Self-pay | Admitting: Family Medicine

## 2013-07-04 ENCOUNTER — Other Ambulatory Visit: Payer: Self-pay | Admitting: Family Medicine

## 2013-07-19 ENCOUNTER — Ambulatory Visit (INDEPENDENT_AMBULATORY_CARE_PROVIDER_SITE_OTHER): Payer: BC Managed Care – PPO | Admitting: Family Medicine

## 2013-07-19 ENCOUNTER — Encounter: Payer: Self-pay | Admitting: Family Medicine

## 2013-07-19 ENCOUNTER — Other Ambulatory Visit: Payer: Self-pay | Admitting: Family Medicine

## 2013-07-19 ENCOUNTER — Ambulatory Visit (INDEPENDENT_AMBULATORY_CARE_PROVIDER_SITE_OTHER): Payer: BC Managed Care – PPO

## 2013-07-19 VITALS — BP 110/62 | HR 68 | Temp 98.0°F | Resp 14 | Ht 65.0 in | Wt 266.5 lb

## 2013-07-19 DIAGNOSIS — N920 Excessive and frequent menstruation with regular cycle: Secondary | ICD-10-CM

## 2013-07-19 DIAGNOSIS — M79609 Pain in unspecified limb: Secondary | ICD-10-CM

## 2013-07-19 DIAGNOSIS — Z23 Encounter for immunization: Secondary | ICD-10-CM

## 2013-07-19 DIAGNOSIS — M79602 Pain in left arm: Secondary | ICD-10-CM

## 2013-07-19 DIAGNOSIS — F17200 Nicotine dependence, unspecified, uncomplicated: Secondary | ICD-10-CM

## 2013-07-19 DIAGNOSIS — M5030 Other cervical disc degeneration, unspecified cervical region: Secondary | ICD-10-CM

## 2013-07-19 DIAGNOSIS — M542 Cervicalgia: Secondary | ICD-10-CM

## 2013-07-19 DIAGNOSIS — Z0000 Encounter for general adult medical examination without abnormal findings: Secondary | ICD-10-CM

## 2013-07-19 LAB — COMPLETE METABOLIC PANEL WITH GFR
ALBUMIN: 4 g/dL (ref 3.5–5.2)
ALK PHOS: 57 U/L (ref 39–117)
ALT: 14 U/L (ref 0–35)
AST: 14 U/L (ref 0–37)
BILIRUBIN TOTAL: 0.5 mg/dL (ref 0.2–1.2)
BUN: 16 mg/dL (ref 6–23)
CO2: 26 mEq/L (ref 19–32)
Calcium: 9 mg/dL (ref 8.4–10.5)
Chloride: 103 mEq/L (ref 96–112)
Creat: 0.65 mg/dL (ref 0.50–1.10)
GFR, Est African American: >89 mL/min
GFR, Est Non African American: >89 mL/min
Glucose, Bld: 92 mg/dL (ref 70–99)
POTASSIUM: 4 meq/L (ref 3.5–5.3)
SODIUM: 138 meq/L (ref 135–145)
TOTAL PROTEIN: 6.4 g/dL (ref 6.0–8.3)

## 2013-07-19 LAB — CBC
HCT: 37.7 % (ref 36.0–46.0)
Hemoglobin: 12.6 g/dL (ref 12.0–15.0)
MCH: 28.3 pg (ref 26.0–34.0)
MCHC: 33.4 g/dL (ref 30.0–36.0)
MCV: 84.7 fL (ref 78.0–100.0)
PLATELETS: 290 10*3/uL (ref 150–400)
RBC: 4.45 MIL/uL (ref 3.87–5.11)
RDW: 14.8 % (ref 11.5–15.5)
WBC: 5.2 10*3/uL (ref 4.0–10.5)

## 2013-07-19 LAB — LIPID PANEL
CHOL/HDL RATIO: 3.8 ratio
Cholesterol: 167 mg/dL (ref 0–200)
HDL: 44 mg/dL (ref >39)
LDL Cholesterol: 100 mg/dL — ABNORMAL HIGH (ref 0–99)
Triglycerides: 117 mg/dL (ref <150)
VLDL: 23 mg/dL (ref 0–40)

## 2013-07-19 LAB — HEMOGLOBIN A1C
HEMOGLOBIN A1C: 5.4 % (ref <5.7)
Mean Plasma Glucose: 108 mg/dL (ref <117)

## 2013-07-19 NOTE — Patient Instructions (Addendum)
Keep up a regular exercise program and make sure you are eating a healthy diet Try to eat 4 servings of dairy a day, or if you are lactose intolerant take a calcium with vitamin D daily.  Your vaccines are up to date.    Smoking Cessation, Tips for Success If you are ready to quit smoking, congratulations! You have chosen to help yourself be healthier. Cigarettes bring nicotine, tar, carbon monoxide, and other irritants into your body. Your lungs, heart, and blood vessels will be able to work better without these poisons. There are many different ways to quit smoking. Nicotine gum, nicotine patches, a nicotine inhaler, or nicotine nasal spray can help with physical craving. Hypnosis, support groups, and medicines help break the habit of smoking. WHAT THINGS CAN I DO TO MAKE QUITTING EASIER?  Here are some tips to help you quit for good:  Pick a date when you will quit smoking completely. Tell all of your friends and family about your plan to quit on that date.  Do not try to slowly cut down on the number of cigarettes you are smoking. Pick a quit date and quit smoking completely starting on that day.  Throw away all cigarettes.   Clean and remove all ashtrays from your home, work, and car.   On a card, write down your reasons for quitting. Carry the card with you and read it when you get the urge to smoke.   Cleanse your body of nicotine. Drink enough water and fluids to keep your urine clear or pale yellow. Do this after quitting to flush the nicotine from your body.   Learn to predict your moods. Do not let a bad situation be your excuse to have a cigarette. Some situations in your life might tempt you into wanting a cigarette.   Never have "just one" cigarette. It leads to wanting another and another. Remind yourself of your decision to quit.   Change habits associated with smoking. If you smoked while driving or when feeling stressed, try other activities to replace smoking. Stand  up when drinking your coffee. Brush your teeth after eating. Sit in a different chair when you read the paper. Avoid alcohol while trying to quit, and try to drink fewer caffeinated beverages. Alcohol and caffeine may urge you to smoke.   Avoid foods and drinks that can trigger a desire to smoke, such as sugary or spicy foods and alcohol.   Ask people who smoke not to smoke around you.   Have something planned to do right after eating or having a cup of coffee. For example, plan to take a walk or exercise.   Try a relaxation exercise to calm you down and decrease your stress. Remember, you may be tense and nervous for the first 2 weeks after you quit, but this will pass.   Find new activities to keep your hands busy. Play with a pen, coin, or rubber band. Doodle or draw things on paper.   Brush your teeth right after eating. This will help cut down on the craving for the taste of tobacco after meals. You can also try mouthwash.   Use oral substitutes in place of cigarettes. Try using lemon drops, carrots, cinnamon sticks, or chewing gum. Keep them handy so they are available when you have the urge to smoke.   When you have the urge to smoke, try deep breathing.   Designate your home as a nonsmoking area.   If you are a heavy smoker,  ask your health care provider about a prescription for nicotine chewing gum. It can ease your withdrawal from nicotine.   Reward yourself. Set aside the cigarette money you save and buy yourself something nice.   Look for support from others. Join a support group or smoking cessation program. Ask someone at home or at work to help you with your plan to quit smoking.   Always ask yourself, "Do I need this cigarette or is this just a reflex?" Tell yourself, "Today, I choose not to smoke," or "I do not want to smoke." You are reminding yourself of your decision to quit.  Do not replace cigarette smoking with electronic cigarettes (commonly called  e-cigarettes). The safety of e-cigarettes is unknown, and some may contain harmful chemicals.  If you relapse, do not give up! Plan ahead and think about what you will do the next time you get the urge to smoke.  HOW WILL I FEEL WHEN I QUIT SMOKING? You may have symptoms of withdrawal because your body is used to nicotine (the addictive substance in cigarettes). You may crave cigarettes, be irritable, feel very hungry, cough often, get headaches, or have difficulty concentrating. The withdrawal symptoms are only temporary. They are strongest when you first quit but will go away within 10 14 days. When withdrawal symptoms occur, stay in control. Think about your reasons for quitting. Remind yourself that these are signs that your body is healing and getting used to being without cigarettes. Remember that withdrawal symptoms are easier to treat than the major diseases that smoking can cause.  Even after the withdrawal is over, expect periodic urges to smoke. However, these cravings are generally short lived and will go away whether you smoke or not. Do not smoke!  WHAT RESOURCES ARE AVAILABLE TO HELP ME QUIT SMOKING? Your health care provider can direct you to community resources or hospitals for support, which may include:  Group support.  Education.  Hypnosis.  Therapy. Document Released: 01/23/2004 Document Revised: 02/14/2013 Document Reviewed: 10/12/2012 [facility] Patient Information 2014 [place], [place].

## 2013-07-19 NOTE — Progress Notes (Signed)
Subjective:     Kimberly Gamble is a 43 y.o. female and is here for a comprehensive physical exam. The patient reports problems - neck pain.  She's had neck and left arm plan going on for about 8 months. It tends to come and go. It radiates all the way down her arm to the left side. She does have a history of carpal tunnel and the left and does wear a night splint at times. She has been taking Aleve and that seems to provide a great amount really for her. Today she's not having any significant discomfort or pain but has had pain up to a 10 level in the past. She denies any old injury. No other worsening or alleviating factors.  She's also had heavy prolonged periods. Her last period lasted almost a month. She would like to schedule that with her OB/GYN down the [place]. She has not had a mammogram. But she says she plans to go down and schedule that today.  Declines flu vaccine today.  History   Social History  . Marital Status: Single    Spouse Name: N/A    Number of Children: N/A  . Years of Education: N/A   Occupational History  . Not on file.   Social History Main Topics  . Smoking status: Current Every Day Smoker -- 0.30 packs/day for 15 years    Types: Cigarettes  . Smokeless tobacco: Not on file     Comment: 1/2 ppd x 15+ years.   . Alcohol Use: No  . Drug Use: No  . Sexual Activity: Not on file   Other Topics Concern  . Not on file   Social History Narrative   Regularly exercises. 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 GED. In college.    Health Maintenance  Topic Date Due  . Influenza Vaccine  12/08/2012  . Pap Smear  06/06/2015  . Tetanus/tdap  04/14/2021    The following portions of the patient's history were reviewed and updated as appropriate: allergies, current medications, past family history, past medical history, past social history, past surgical history and problem list.  Review of Systems A comprehensive review of systems was negative.   Objective:    BP 110/62  Pulse 68  Temp(Src) 98 F (36.7 C)  Resp 14  Ht 5\' 5"  (1.651 m)  Wt 266 lb 8 oz (120.884 kg)  BMI 44.35 kg/m2  SpO2 95% General appearance: alert, cooperative and appears stated age Head: Normocephalic, without obvious abnormality, atraumatic Eyes: conj clear, EOMi, PEERLA Ears: normal TM's and external ear canals both ears Nose: Nares normal. Septum midline. Mucosa normal. No drainage or sinus tenderness. Throat: lips, mucosa, and tongue normal; teeth and gums normal Neck: no adenopathy, no carotid bruit, no JVD, supple, symmetrical, trachea midline and thyroid not enlarged, symmetric, no tenderness/mass/nodules Back: symmetric, no curvature. ROM normal. No CVA tenderness. Lungs: clear to auscultation bilaterally Breasts: normal appearance, no masses or tenderness Heart: regular rate and rhythm, S1, S2 normal, no murmur, click, rub or gallop Abdomen: soft, non-tender; bowel sounds normal; no masses,  no organomegaly Extremities: extremities normal, atraumatic, no cyanosis or edema Pulses: 2+ and symmetric Skin: Skin color, texture, turgor normal. No rashes or lesions Lymph nodes: Cervical, supraclavicular, and axillary nodes normal. Neurologic: Alert and oriented X 3, normal strength and tone. Normal symmetric reflexes. Normal coordination and gait    Assessment:    Healthy female exam.      Plan:     See After Visit Summary  for Counseling Recommendations  Keep up a regular exercise program and make sure you are eating a healthy diet Try to eat 4 servings of dairy a day, or if you are lactose intolerant take a calcium with vitamin D daily.  Your vaccines are up to date.  She plans to go downstairs to schedule her mammogram. We did place the order previously and they have tried to call her multiple times that they were unable to reach her.  Menorrhagia-she plans on scheduling an appointment with OB/GYN for further evaluation. I strongly recommended this and she is  over the age of 16. She will likely need further workup including ultrasound and endometrial biopsy. And they can also discuss treatment plans.  Neck pain/left arm pain-with radiation of symptoms for her neck we'll go ahead and order and x-ray since the pain has been persistent for over 6 months. She does get some relief with Aleve so certainly can continue this. Make sure take with food and water and stop immediately if any GI upset or irritation. We'll call her with the results once available. She might benefit from physical therapy as well.  Tobacco abuse-encourage cessation discussed I'm here for her she would like to discuss any further treatment options to help her quit. Also recommend getting her pneumonia vaccine today because of smoking history. Patient agrees.

## 2013-07-20 NOTE — Progress Notes (Signed)
Quick Note:  All labs are normal. ______ 

## 2013-08-15 ENCOUNTER — Encounter: Payer: BC Managed Care – PPO | Admitting: Family Medicine

## 2013-08-28 ENCOUNTER — Ambulatory Visit (INDEPENDENT_AMBULATORY_CARE_PROVIDER_SITE_OTHER): Payer: BC Managed Care – PPO

## 2013-08-28 DIAGNOSIS — R928 Other abnormal and inconclusive findings on diagnostic imaging of breast: Secondary | ICD-10-CM

## 2013-08-28 DIAGNOSIS — Z1231 Encounter for screening mammogram for malignant neoplasm of breast: Secondary | ICD-10-CM

## 2013-08-28 DIAGNOSIS — Z0000 Encounter for general adult medical examination without abnormal findings: Secondary | ICD-10-CM

## 2013-08-31 ENCOUNTER — Other Ambulatory Visit: Payer: Self-pay | Admitting: Family Medicine

## 2013-08-31 DIAGNOSIS — R928 Other abnormal and inconclusive findings on diagnostic imaging of breast: Secondary | ICD-10-CM

## 2013-09-05 ENCOUNTER — Encounter: Payer: Self-pay | Admitting: Obstetrics & Gynecology

## 2013-09-05 ENCOUNTER — Ambulatory Visit (INDEPENDENT_AMBULATORY_CARE_PROVIDER_SITE_OTHER): Payer: BC Managed Care – PPO | Admitting: Obstetrics & Gynecology

## 2013-09-05 VITALS — BP 132/87 | HR 99 | Resp 16 | Ht 65.0 in | Wt 264.0 lb

## 2013-09-05 DIAGNOSIS — N920 Excessive and frequent menstruation with regular cycle: Secondary | ICD-10-CM

## 2013-09-05 DIAGNOSIS — D259 Leiomyoma of uterus, unspecified: Secondary | ICD-10-CM

## 2013-09-05 DIAGNOSIS — D219 Benign neoplasm of connective and other soft tissue, unspecified: Secondary | ICD-10-CM | POA: Insufficient documentation

## 2013-09-05 NOTE — Progress Notes (Signed)
   Subjective:    Patient ID: Kimberly Gamble, female    DOB: [birth year], 43 y.o.   MRN: [id number]  HPI  43 yo M G3P4 (25, 90, twin 43 yos) here today because of the complaint of heavy and sometimes painful periods. She was 43 y.o. told in 2010 that she has fibroids but no treatment. She denies dyspareunia. She reports that she bled from Jan 20-Feb 21.  Review of Systems Works at [employer]. Married for 14 years. Pap normal 1/14, always normal. Mammmogram this month.    Objective:   Physical Exam  Cervix appears normal Bimanual reveals uterus to be 16 week size with fibroid more on right       Assessment & Plan:  Fibroids and menometrorrhagia- I have ordered an u/s but have explained that a TAH/BS would be one treatment option

## 2013-09-05 NOTE — Patient Instructions (Signed)
Hysterectomy Information  A hysterectomy is a surgery in which your uterus is removed. This surgery may be done to treat various medical problems. After the surgery, you will no longer have menstrual periods. The surgery will also make you unable to become pregnant (sterile). The fallopian tubes and ovaries can be removed (bilateral salpingo-oophorectomy) during this surgery as well.  REASONS FOR A HYSTERECTOMY  Persistent, abnormal bleeding.  Lasting (chronic) pelvic pain or infection.  The lining of the uterus (endometrium) starts growing outside the uterus (endometriosis).  The endometrium starts growing in the muscle of the uterus (adenomyosis).  The uterus falls down into the vagina (pelvic organ prolapse).  Noncancerous growths in the uterus (uterine fibroids) that cause symptoms.  Precancerous cells.  Cervical cancer or uterine cancer. TYPES OF HYSTERECTOMIES  Supracervical hysterectomy In this type, the top part of the uterus is removed, but not the cervix.  Total hysterectomy The uterus and cervix are removed.  Radical hysterectomy The uterus, the cervix, and the fibrous tissue that holds the uterus in place in the pelvis (parametrium) are removed. WAYS A HYSTERECTOMY CAN BE PERFORMED  Abdominal hysterectomy A large surgical cut (incision) is made in the abdomen. The uterus is removed through this incision.  Vaginal hysterectomy An incision is made in the vagina. The uterus is removed through this incision. There are no abdominal incisions.  Conventional laparoscopic hysterectomy Three or four small incisions are made in the abdomen. A thin, lighted tube with a camera (laparoscope) is inserted into one of the incisions. Other tools are put through the other incisions. The uterus is cut into small pieces. The small pieces are removed through the incisions, or they are removed through the vagina.  Laparoscopically assisted vaginal hysterectomy (LAVH) Three or four small  incisions are made in the abdomen. Part of the surgery is performed laparoscopically and part vaginally. The uterus is removed through the vagina.  Robot-assisted laparoscopic hysterectomy A laparoscope and other tools are inserted into 3 or 4 small incisions in the abdomen. A computer-controlled device is used to give the surgeon a 3D image and to help control the surgical instruments. This allows for more precise movements of surgical instruments. The uterus is cut into small pieces and removed through the incisions or removed through the vagina. RISKS AND COMPLICATIONS  Possible complications associated with this procedure include:  Bleeding and risk of blood transfusion. Tell your health care provider if you do not want to receive any blood products.  Blood clots in the legs or lung.  Infection.  Injury to surrounding organs.  Problems or side effects related to anesthesia.  Conversion to an abdominal hysterectomy from one of the other techniques. WHAT TO EXPECT AFTER A HYSTERECTOMY  You will be given pain medicine.  You will need to have someone with you for the first 3 5 days after you go home.  You will need to follow up with your surgeon in 2 4 weeks after surgery to evaluate your progress.  You may have early menopause symptoms such as hot flashes, night sweats, and insomnia.  If you had a hysterectomy for a problem that was not cancer or not a condition that could lead to cancer, then you no longer need Pap tests. However, even if you no longer need a Pap test, a regular exam is a good idea to make sure no other problems are starting. Document Released: 10/20/2000 Document Revised: 02/14/2013 Document Reviewed: 01/01/2013 [facility] Patient Information 2014 [place]. Fibroids Fibroids are lumps (tumors)  that can occur any place in a woman's body. These lumps are not cancerous. Fibroids vary in size, weight, and where they grow. HOME CARE  Do not take aspirin.  Write  down the number of pads or tampons you use during your period. Tell your doctor. This can help determine the best treatment for you. GET HELP RIGHT AWAY IF:  You have pain in your lower belly (abdomen) that is not helped with medicine.  You have cramps that are not helped with medicine.  You have more bleeding between or during your period.  You feel lightheaded or pass out (faint).  Your lower belly pain gets worse. MAKE SURE YOU:  Understand these instructions.  Will watch your condition.  Will get help right away if you are not doing well or get worse. Document Released: 05/29/2010 Document Revised: 07/19/2011 Document Reviewed: 05/29/2010 [facility] Patient Information 2014 [place], [place].  Uterine artery embolization

## 2013-09-19 ENCOUNTER — Encounter (INDEPENDENT_AMBULATORY_CARE_PROVIDER_SITE_OTHER): Payer: Self-pay

## 2013-09-19 ENCOUNTER — Ambulatory Visit
Admission: RE | Admit: 2013-09-19 | Discharge: 2013-09-19 | Disposition: A | Discharge status: Home / Self Care | Payer: BC Managed Care – PPO | Source: Ambulatory Visit | Attending: Family Medicine | Admitting: Family Medicine

## 2013-09-19 DIAGNOSIS — R928 Other abnormal and inconclusive findings on diagnostic imaging of breast: Secondary | ICD-10-CM

## 2013-09-24 ENCOUNTER — Other Ambulatory Visit: Payer: BC Managed Care – PPO

## 2013-09-24 ENCOUNTER — Encounter: Payer: Self-pay | Admitting: Sports Medicine

## 2013-09-24 ENCOUNTER — Ambulatory Visit (INDEPENDENT_AMBULATORY_CARE_PROVIDER_SITE_OTHER): Payer: BC Managed Care – PPO | Admitting: Sports Medicine

## 2013-09-24 VITALS — BP 116/75 | HR 103 | Ht 65.0 in | Wt 257.0 lb

## 2013-09-24 DIAGNOSIS — M5416 Radiculopathy, lumbar region: Secondary | ICD-10-CM | POA: Insufficient documentation

## 2013-09-24 DIAGNOSIS — IMO0002 Reserved for concepts with insufficient information to code with codable children: Secondary | ICD-10-CM

## 2013-09-24 DIAGNOSIS — M5412 Radiculopathy, cervical region: Secondary | ICD-10-CM | POA: Insufficient documentation

## 2013-09-24 MED ORDER — PREDNISONE 50 MG PO TABS: ORAL_TABLET | ORAL | Status: DC

## 2013-09-24 MED ORDER — CYCLOBENZAPRINE HCL 10 MG PO TABS: ORAL_TABLET | ORAL | Status: DC

## 2013-09-24 MED ORDER — MELOXICAM 15 MG PO TABS: ORAL_TABLET | ORAL | Status: DC
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
## 2013-09-24 NOTE — Progress Notes (Signed)
   Subjective:    I'm seeing this patient as a consultation for:  Dr. Madilyn Fireman  CC: Neck and back symptoms  HPI: This is a pleasant 43 year old female, for the past several months she's had numbness and tingling radiating down the left arm in the C7 distribution down the left leg in an L5 distribution. Moderate, persistent, worse with sitting, worse with Valsalva. She has not had physical therapy, steroids, NSAIDs, muscle relaxers. No bowel or bladder dysfunction, no constitutional symptoms.  Past medical history, Surgical history, Family history not pertinant except as noted below, Social history, Allergies, and medications have been entered into the medical record, reviewed, and no changes needed.   Review of Systems: No headache, visual changes, nausea, vomiting, diarrhea, constipation, dizziness, abdominal pain, skin rash, fevers, chills, night sweats, weight loss, swollen lymph nodes, body aches, joint swelling, muscle aches, chest pain, shortness of breath, mood changes, visual or auditory hallucinations.   Objective:   General: Well Developed, well nourished, and in no acute distress.  Neuro/Psych: Alert and oriented x3, extra-ocular muscles intact, able to move all 4 extremities, sensation grossly intact. Skin: Warm and dry, no rashes noted.  Respiratory: Not using accessory muscles, speaking in full sentences, trachea midline.  Cardiovascular: Pulses palpable, no extremity edema. Abdomen: Does not appear distended. Neck: Inspection unremarkable. No palpable stepoffs. Negative Spurling's maneuver. Full neck range of motion Grip strength and sensation normal in bilateral hands Strength good C4 to T1 distribution No sensory change to C4 to T1 Negative Hoffman sign bilaterally Reflexes normal Back Exam:  Inspection: Unremarkable  Motion: Flexion 45 deg, Extension 45 deg, Side Bending to 45 deg bilaterally,  Rotation to 45 deg bilaterally  SLR laying: Negative  XSLR laying:  Negative  Palpable tenderness: None. FABER: negative. Sensory change: Gross sensation intact to all lumbar and sacral dermatomes.  Reflexes: 2+ at both patellar tendons, 2+ at achilles tendons, Babinski's downgoing.  Strength at foot  Plantar-flexion: 5/5 Dorsi-flexion: 5/5 Eversion: 5/5 Inversion: 5/5  Leg strength  Quad: 5/5 Hamstring: 5/5 Hip flexor: 5/5 Hip abductors: 5/5  Gait unremarkable.  C-spine x-rays were reviewed and do show multilevel degenerative changes.  Impression and Recommendations:   This case required medical decision making of moderate complexity.
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
## 2013-09-25 ENCOUNTER — Other Ambulatory Visit: Payer: BC Managed Care – PPO

## 2013-09-26 ENCOUNTER — Ambulatory Visit: Payer: BC Managed Care – PPO | Admitting: Obstetrics & Gynecology

## 2014-01-15 ENCOUNTER — Other Ambulatory Visit: Payer: Self-pay | Admitting: Family Medicine

## 2014-01-18 ENCOUNTER — Other Ambulatory Visit: Payer: Self-pay | Admitting: Family Medicine

## 2014-03-11 ENCOUNTER — Encounter: Payer: Self-pay | Admitting: Sports Medicine

## 2014-07-05 ENCOUNTER — Other Ambulatory Visit: Payer: Self-pay | Admitting: Family Medicine

## 2014-08-08 ENCOUNTER — Other Ambulatory Visit: Payer: Self-pay | Admitting: Family Medicine

## 2014-09-09 ENCOUNTER — Other Ambulatory Visit: Payer: Self-pay | Admitting: Family Medicine

## 2014-10-29 ENCOUNTER — Ambulatory Visit (INDEPENDENT_AMBULATORY_CARE_PROVIDER_SITE_OTHER): Payer: BC Managed Care – PPO | Admitting: Family Medicine

## 2014-10-29 ENCOUNTER — Encounter: Payer: Self-pay | Admitting: Family Medicine

## 2014-10-29 VITALS — BP 141/79 | HR 77 | Ht 65.0 in | Wt 240.0 lb

## 2014-10-29 DIAGNOSIS — B351 Tinea unguium: Secondary | ICD-10-CM

## 2014-10-29 DIAGNOSIS — Z13220 Encounter for screening for lipoid disorders: Secondary | ICD-10-CM | POA: Diagnosis not present

## 2014-10-29 DIAGNOSIS — R7309 Other abnormal glucose: Secondary | ICD-10-CM

## 2014-10-29 DIAGNOSIS — I10 Essential (primary) hypertension: Secondary | ICD-10-CM | POA: Diagnosis not present

## 2014-10-29 LAB — LIPID PANEL
CHOLESTEROL: 164 mg/dL (ref 0–200)
HDL: 47 mg/dL (ref >=46)
LDL Cholesterol: 96 mg/dL (ref 0–99)
Total CHOL/HDL Ratio: 3.5 Ratio
Triglycerides: 103 mg/dL (ref <150)
VLDL: 21 mg/dL (ref 0–40)

## 2014-10-29 LAB — COMPLETE METABOLIC PANEL WITH GFR
ALBUMIN: 3.9 g/dL (ref 3.5–5.2)
ALK PHOS: 57 U/L (ref 39–117)
ALT: 18 U/L (ref 0–35)
AST: 16 U/L (ref 0–37)
BUN: 10 mg/dL (ref 6–23)
CO2: 22 mEq/L (ref 19–32)
Calcium: 9.1 mg/dL (ref 8.4–10.5)
Chloride: 105 mEq/L (ref 96–112)
Creat: 0.61 mg/dL (ref 0.50–1.10)
GFR, Est African American: >89 mL/min
GFR, Est Non African American: >89 mL/min
Glucose, Bld: 84 mg/dL (ref 70–99)
POTASSIUM: 4.3 meq/L (ref 3.5–5.3)
Sodium: 140 mEq/L (ref 135–145)
Total Bilirubin: 0.5 mg/dL (ref 0.2–1.2)
Total Protein: 6.4 g/dL (ref 6.0–8.3)

## 2014-10-29 LAB — POCT GLYCOSYLATED HEMOGLOBIN (HGB A1C): Hemoglobin A1C: 5.5

## 2014-10-29 MED ORDER — LISINOPRIL-HYDROCHLOROTHIAZIDE 20-12.5 MG PO TABS: 1.0000 | ORAL_TABLET | Freq: Every day | ORAL | Status: DC

## 2014-10-29 MED ORDER — TERBINAFINE HCL 250 MG PO TABS: 250.0000 mg | ORAL_TABLET | Freq: Every day | ORAL | Status: DC
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
## 2015-05-20 ENCOUNTER — Other Ambulatory Visit: Payer: Self-pay | Admitting: Family Medicine

## 2015-06-11 ENCOUNTER — Encounter: Payer: Self-pay | Admitting: Family Medicine

## 2015-06-11 ENCOUNTER — Ambulatory Visit (INDEPENDENT_AMBULATORY_CARE_PROVIDER_SITE_OTHER): Payer: BC Managed Care – PPO | Admitting: Family Medicine

## 2015-06-11 VITALS — BP 126/62 | HR 66 | Temp 98.1°F | Wt 240.0 lb

## 2015-06-11 DIAGNOSIS — F17200 Nicotine dependence, unspecified, uncomplicated: Secondary | ICD-10-CM

## 2015-06-11 DIAGNOSIS — Z6839 Body mass index (BMI) 39.0-39.9, adult: Secondary | ICD-10-CM | POA: Diagnosis not present

## 2015-06-11 DIAGNOSIS — Z0189 Encounter for other specified special examinations: Secondary | ICD-10-CM

## 2015-06-11 DIAGNOSIS — E669 Obesity, unspecified: Secondary | ICD-10-CM | POA: Diagnosis not present

## 2015-06-11 DIAGNOSIS — I10 Essential (primary) hypertension: Secondary | ICD-10-CM

## 2015-06-11 DIAGNOSIS — Z0000 Encounter for general adult medical examination without abnormal findings: Secondary | ICD-10-CM

## 2015-06-11 NOTE — Progress Notes (Signed)
Subjective:     Kimberly Gamble is a 45 y.o. female and is here for a comprehensive physical exam. The patient reports problems - needs DMV form .   Hypertension- Pt denies chest pain, SOB, dizziness, or heart palpitations.  Taking meds as directed w/o problems.  Denies medication side effects.    Tobacco abuse-she is smoking about a third of a pack a day. She is interested in quitting. She said the longest she's been able to quit on her own was about 3 days.   Social History   Social History  . Marital Status: Single    Spouse Name: N/A  . Number of Children: N/A  . Years of Education: N/A   Occupational History  . Recruitment consultant for school      Social History Main Topics  . Smoking status: Current Every Day Smoker -- 0.30 packs/day for 15 years    Types: Cigarettes  . Smokeless tobacco: Not on file     Comment: 1/2 ppd x 15+ years.   . Alcohol Use: No  . Drug Use: No  . Sexual Activity: Not on file   Other Topics Concern  . Not on file   Social History Narrative   Regularly exercises 3 days per week. 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 In college. Has been walking for exercise.     Health Maintenance  Topic Date Due  . HIV Screening  07/16/1985  . INFLUENZA VACCINE  02/08/2016 (Originally 12/09/2014)  . PAP SMEAR  06/05/2017  . TETANUS/TDAP  04/14/2021    The following portions of the patient's history were reviewed and updated as appropriate: allergies, current medications, past family history, past medical history, past social history, past surgical history and problem list.  Review of Systems A comprehensive review of systems was negative.   Objective:    BP 126/62 mmHg  Pulse 66  Temp(Src) 98.1 F (36.7 C) (Oral)  Wt 240 lb (108.863 kg)  SpO2 98% General appearance: alert, cooperative and appears stated age Head: Normocephalic, without obvious abnormality, atraumatic Eyes: conj clear, EOMI, PEERLA Ears: normal TM's and external ear canals both  ears Nose: Nares normal. Septum midline. Mucosa normal. No drainage or sinus tenderness. Throat: lips, mucosa, and tongue normal; teeth and gums normal Neck: no adenopathy, no carotid bruit, no JVD, supple, symmetrical, trachea midline and thyroid not enlarged, symmetric, no tenderness/mass/nodules Back: symmetric, no curvature. ROM normal. No CVA tenderness. Lungs: clear to auscultation bilaterally Breasts: normal appearance, no masses or tenderness Heart: regular rate and rhythm, S1, S2 normal, no murmur, click, rub or gallop Abdomen: soft, non-tender; bowel sounds normal; no masses,  no organomegaly Extremities: extremities normal, atraumatic, no cyanosis or edema Pulses: 2+ and symmetric Skin: Skin color, texture, turgor normal. No rashes or lesions Lymph nodes: Cervical, supraclavicular, and axillary nodes normal. Neurologic: Alert and oriented X 3, normal strength and tone. Normal symmetric reflexes. Normal coordination and gait    Assessment:    Healthy female exam.      Plan:     See After Visit Summary for Counseling Recommendations  Keep up a regular exercise program and make sure you are eating a healthy diet Try to eat 4 servings of dairy a day, or if you are lactose intolerant take a calcium with vitamin D daily.  Your vaccines are up to date.   DMV form completed. She will take the vision portion to her eye doctor.  Hypertension-well-controlled. Continue current regimen. Due for BMP. Follow-up in 6  months.  Obesity/BMI 39-discussed that she may be a candidate for weight loss medication. She actually has done really well and has lost over 20 pounds in the last year or 2. She just feels like she struggling right now has plateaued. She's had a the craving for sweets. We discussed some options but she doesn't check with her insurance on coverage.  Tobacco abuse-we had a discussion today about ways to assist her in quitting. We discussed Wellbutrin versus Chantix versus  nicotine replacement and the pros and cons. Encouraged her to think about her options and let me know if she is interested. Also recommended that she take advantage of a quick program such as 1 800 quit now.

## 2015-06-11 NOTE — Patient Instructions (Signed)
Lorcaserin oral tablets What is this medicine? LORCASERIN (lor ca SER in) is used to promote and maintain weight loss in obese patients. This medicine should be used with a reduced calorie diet and, if appropriate, an exercise program. This medicine may be used for other purposes; ask your health care provider or pharmacist if you have questions. What should I tell my health care provider before I take this medicine? They need to know if you have any of these conditions: -anatomical deformation of the penis, Peyronie's disease, or history of priapism (painful and prolonged erection) -diabetes -heart disease -history of blood diseases, like sickle cell anemia or leukemia -history of irregular heartbeat -kidney disease -liver disease -suicidal thoughts, plans, or attempt; a previous suicide attempt by you or a family member -an unusual or allergic reaction to lorcaserin, other medicines, foods, dyes, or preservatives -pregnant or trying to get pregnant -breast-feeding How should I use this medicine? Take this medicine by mouth with a glass of water. Follow the directions on the prescription label. You can take it with or without food. Take your medicine at regular intervals. Do not take it more often than directed. Do not stop taking except on your doctor's advice. Talk to your pediatrician regarding the use of this medicine in children. Special care may be needed. Overdosage: If you think you have taken too much of this medicine contact a poison control center or emergency room at once. NOTE: This medicine is only for you. Do not share this medicine with others. What if I miss a dose? If you miss a dose, take it as soon as you can. If it is almost time for your next dose, take only that dose. Do not take double or extra doses. What may interact with this medicine? -cabergoline -certain medicines for depression, anxiety, or psychotic disturbances -certain medicines for erectile  dysfunction -certain medicines for migraine headache like almotriptan, eletriptan, frovatriptan, naratriptan, rizatriptan, sumatriptan, zolmitriptan -dextromethorphan -linezolid -lithium -medicines for diabetes -other weight loss products -tramadol -St. John's Wort -stimulant medicines for attention disorders, weight loss, or to stay awake -tryptophan This list may not describe all possible interactions. Give your health care provider a list of all the medicines, herbs, non-prescription drugs, or dietary supplements you use. Also tell them if you smoke, drink alcohol, or use illegal drugs. Some items may interact with your medicine. What should I watch for while using this medicine? This medicine is intended to be used in addition to a healthy diet and appropriate exercise. The best results are achieved this way. Your doctor should instruct you to stop taking this medicine if you do not lose a certain amount of weight within the first 12 weeks of treatment, but it is important that you do not change your dose in any way without consulting your doctor or health care professional. Visit your doctor or health care professional for regular checkups. Your doctor may order blood tests or other tests to see how you are doing. Do not drive, use machinery, or do anything that needs mental alertness until you know how this medicine affects you. This medicine may affect blood sugar levels. If you have diabetes, check with your doctor or health care professional before you change your diet or the dose of your diabetic medicine. Patients and their families should watch out for worsening depression or thoughts of suicide. Also watch out for sudden changes in feelings such as feeling anxious, agitated, panicky, irritable, hostile, aggressive, impulsive, severely restless, overly excited and hyperactive, or   not being able to sleep. If this happens, especially at the beginning of treatment or after a change in dose,  call your health care professional. Contact your doctor or health care professional right away if you are a man with an erection that lasts longer than 4 hours or if the erection becomes painful. This may be a sign of serious problem and must be treated right away to prevent permanent damage. What side effects may I notice from receiving this medicine? Side effects that you should report to your doctor or health care professional as soon as possible: -allergic reactions like skin rash, itching or hives, swelling of the face, lips, or tongue -abnormal production of milk -breast enlargement in both males and females -breathing problems -changes in emotions or moods -changes in vision -confusion -erection lasting more than 4 hours or a painful erection -fast or irregular heart beat -feeling faint or lightheaded, falls -fever or chills, sore throat -hallucination, loss of contact with reality -high or low blood pressure -menstrual changes -restlessness -slow or irregular heartbeat -stiff muscles -sweating -suicidal thoughts or other mood changes -swelling of the ankles, feet, hands -unusually weak or tired -vomiting Side effects that usually do not require medical attention (Report these to your doctor or health care professional if they continue or are bothersome.): -back pain -constipation -cough -dry mouth -nausea -tiredness This list may not describe all possible side effects. Call your doctor for medical advice about side effects. You may report side effects to FDA at 1-800-FDA-1088. Where should I keep my medicine? Keep out of the reach of children. This medicine can be abused. Keep your medicine in a safe place to protect it from theft. Do not share this medicine with anyone. Selling or giving away this medicine is dangerous and against the law. Store at room temperature between 15 and 30 degrees C (59 and 86 degrees F). Throw away any unused medicine after the expiration  date. NOTE: This sheet is a summary. It may not cover all possible information. If you have questions about this medicine, talk to your doctor, pharmacist, or health care provider.    2016, Elsevier/Gold Standard. (2014-12-02 16:21:05) Liraglutide injection (Weight Management) What is this medicine? LIRAGLUTIDE (LIR a GLOO tide) is used with a reduced calorie diet and exercise to help you lose weight. This medicine may be used for other purposes; ask your health care provider or pharmacist if you have questions. What should I tell my health care provider before I take this medicine? They need to know if you have any of these conditions: -endocrine tumors (MEN 2) or if someone in your family had these tumors -gallstones -high cholesterol -history of alcohol abuse problem -history of pancreatitis -kidney disease or if you are on dialysis -liver disease -previous swelling of the tongue, face, or lips with difficulty breathing, difficulty swallowing, hoarseness, or tightening of the throat -stomach problems -suicidal thoughts, plans, or attempt; a previous suicide attempt by you or a family member -thyroid cancer or if someone in your family had thyroid cancer -an unusual or allergic reaction to liraglutide, medicines, foods, dyes, or preservatives -pregnant or trying to get pregnant -breast-feeding How should I use this medicine? This medicine is for injection under the skin of your upper leg, stomach area, or upper arm. You will be taught how to prepare and give this medicine. Use exactly as directed. Take your medicine at regular intervals. Do not take it more often than directed. It is important that you put your used  needles and syringes in a special sharps container. Do not put them in a trash can. If you do not have a sharps container, call your pharmacist or healthcare provider to get one. A special MedGuide will be given to you by the pharmacist with each prescription and refill. Be  sure to read this information carefully each time. Talk to your pediatrician regarding the use of this medicine in children. Special care may be needed. Overdosage: If you think you have taken too much of this medicine contact a poison control center or emergency room at once. NOTE: This medicine is only for you. Do not share this medicine with others. What if I miss a dose? If you miss a dose, take it as soon as you can. If it is almost time for your next dose, take only that dose. Do not take double or extra doses. If you miss your dose for 3 days or more, call your doctor or health care professional to talk about how to restart this medicine. What may interact with this medicine? -acetaminophen -atorvastatin -birth control pills -digoxin -griseofulvin -lisinopril This list may not describe all possible interactions. Give your health care provider a list of all the medicines, herbs, non-prescription drugs, or dietary supplements you use. Also tell them if you smoke, drink alcohol, or use illegal drugs. Some items may interact with your medicine. What should I watch for while using this medicine? Visit your doctor or health care professional for regular checks on your progress. This medicine is intended to be used in addition to a healthy diet and appropriate exercise. The best results are achieved this way. Do not increase or in any way change your dose without consulting your doctor or health care professional. This medicine may affect blood sugar levels. If you have diabetes, check with your doctor or health care professional before you change your diet or the dose of your diabetic medicine. Patients and their families should watch out for worsening depression or thoughts of suicide. Also watch out for sudden changes in feelings such as feeling anxious, agitated, panicky, irritable, hostile, aggressive, impulsive, severely restless, overly excited and hyperactive, or not being able to sleep. If  this happens, especially at the beginning of treatment or after a change in dose, call your health care professional. What side effects may I notice from receiving this medicine? Side effects that you should report to your doctor or health care professional as soon as possible: -allergic reactions like skin rash, itching or hives, swelling of the face, lips, or tongue -breathing problems -fever, chills -loss of appetite -signs and symptoms of low blood sugar such as feeling anxious, confusion, dizziness, increased hunger, unusually weak or tired, sweating, shakiness, cold, irritable, headache, blurred vision, fast heartbeat, loss of consciousness -trouble passing urine or change in the amount of urine -unusual stomach pain or upset -vomiting Side effects that usually do not require medical attention (Report these to your doctor or health care professional if they continue or are bothersome.): -constipation -diarrhea -fatigue -headache -nausea This list may not describe all possible side effects. Call your doctor for medical advice about side effects. You may report side effects to FDA at 1-800-FDA-1088. Where should I keep my medicine? Keep out of the reach of children. Store unopened pen in a refrigerator between 2 and 8 degrees C (36 and 46 degrees F). Do not freeze or use if the medicine has been frozen. Protect from light and excessive heat. After you first use the pen, it  can be stored at room temperature between 15 and 30 degrees C (59 and 86 degrees F) or in a refrigerator. Throw away your used pen after 30 days or after the expiration date, whichever comes first. Do not store your pen with the needle attached. If the needle is left on, medicine may leak from the pen. NOTE: This sheet is a summary. It may not cover all possible information. If you have questions about this medicine, talk to your doctor, pharmacist, or health care provider.    2016, Elsevier/Gold Standard. (2013-06-21  12:29:49)

## 2015-06-12 LAB — BASIC METABOLIC PANEL WITH GFR
BUN: 10 mg/dL (ref 7–25)
CO2: 25 mmol/L (ref 20–31)
Calcium: 9.1 mg/dL (ref 8.6–10.2)
Chloride: 102 mmol/L (ref 98–110)
Creat: 0.72 mg/dL (ref 0.50–1.10)
GFR, Est African American: >89 mL/min (ref >=60)
GLUCOSE: 86 mg/dL (ref 65–99)
POTASSIUM: 3.9 mmol/L (ref 3.5–5.3)
Sodium: 137 mmol/L (ref 135–146)

## 2015-06-12 LAB — HEMOGLOBIN A1C
HEMOGLOBIN A1C: 5.6 % (ref <5.7)
Mean Plasma Glucose: 114 mg/dL (ref <117)

## 2015-06-25 ENCOUNTER — Other Ambulatory Visit: Payer: Self-pay | Admitting: Family Medicine

## 2015-06-30 MED ORDER — LISINOPRIL-HYDROCHLOROTHIAZIDE 20-12.5 MG PO TABS: 1.0000 | ORAL_TABLET | Freq: Every day | ORAL | Status: DC

## 2015-06-30 NOTE — Addendum Note (Signed)
Addended by: Huel Cote on: 06/30/2015 04:47 PM   Modules accepted: Orders

## 2015-12-19 ENCOUNTER — Other Ambulatory Visit: Payer: Self-pay | Admitting: Family Medicine

## 2015-12-26 ENCOUNTER — Other Ambulatory Visit: Payer: Self-pay | Admitting: Family Medicine

## 2016-01-07 ENCOUNTER — Telehealth: Payer: Self-pay | Admitting: Family Medicine

## 2016-01-07 NOTE — Telephone Encounter (Signed)
I called pt and left message that she is due for a F/u appt with Dr. Madilyn Fireman for a F/u on HTN and to call our office to schedule

## 2016-04-08 ENCOUNTER — Encounter: Payer: Self-pay | Admitting: Family Medicine

## 2016-04-08 ENCOUNTER — Other Ambulatory Visit: Payer: Self-pay | Admitting: Family Medicine

## 2016-04-08 ENCOUNTER — Ambulatory Visit (INDEPENDENT_AMBULATORY_CARE_PROVIDER_SITE_OTHER): Payer: BC Managed Care – PPO | Admitting: Family Medicine

## 2016-04-08 VITALS — BP 140/78 | HR 76 | Ht 65.0 in | Wt 240.0 lb

## 2016-04-08 DIAGNOSIS — Z1231 Encounter for screening mammogram for malignant neoplasm of breast: Secondary | ICD-10-CM

## 2016-04-08 DIAGNOSIS — I10 Essential (primary) hypertension: Secondary | ICD-10-CM | POA: Diagnosis not present

## 2016-04-08 DIAGNOSIS — R7309 Other abnormal glucose: Secondary | ICD-10-CM

## 2016-04-08 DIAGNOSIS — Z720 Tobacco use: Secondary | ICD-10-CM

## 2016-04-08 DIAGNOSIS — Z13220 Encounter for screening for lipoid disorders: Secondary | ICD-10-CM

## 2016-04-08 LAB — LIPID PANEL
Cholesterol: 177 mg/dL (ref <200)
HDL: 55 mg/dL (ref >50)
LDL Cholesterol: 105 mg/dL — ABNORMAL HIGH (ref <100)
Total CHOL/HDL Ratio: 3.2 Ratio (ref <5.0)
Triglycerides: 85 mg/dL (ref <150)
VLDL: 17 mg/dL (ref <30)

## 2016-04-08 LAB — COMPLETE METABOLIC PANEL WITH GFR
ALK PHOS: 43 U/L (ref 33–115)
ALT: 8 U/L (ref 6–29)
AST: 12 U/L (ref 10–35)
Albumin: 3.9 g/dL (ref 3.6–5.1)
BILIRUBIN TOTAL: 0.5 mg/dL (ref 0.2–1.2)
BUN: 13 mg/dL (ref 7–25)
CO2: 24 mmol/L (ref 20–31)
Calcium: 8.8 mg/dL (ref 8.6–10.2)
Chloride: 107 mmol/L (ref 98–110)
Creat: 0.68 mg/dL (ref 0.50–1.10)
GFR, Est African American: >89 mL/min (ref >=60)
Glucose, Bld: 79 mg/dL (ref 65–99)
Potassium: 3.8 mmol/L (ref 3.5–5.3)
Sodium: 138 mmol/L (ref 135–146)
TOTAL PROTEIN: 6.3 g/dL (ref 6.1–8.1)

## 2016-04-08 LAB — POCT GLYCOSYLATED HEMOGLOBIN (HGB A1C): Hemoglobin A1C: 5.2

## 2016-04-08 NOTE — Patient Instructions (Addendum)
Continue to work on healthy diet and keep up the walking.  I would love to see you lose about 5 lbs over the next 6 months.     Steps to Quit Smoking Smoking tobacco can be bad for your health. It can also affect almost every organ in your body. Smoking puts you and people around you at risk for many serious long-lasting (chronic) diseases. Quitting smoking is hard, but it is one of the best things that you can do for your health. It is never too late to quit. What are the benefits of quitting smoking? When you quit smoking, you lower your risk for getting serious diseases and conditions. They can include:  Lung cancer or lung disease.  Heart disease.  Stroke.  Heart attack.  Not being able to have children (infertility).  Weak bones (osteoporosis) and broken bones (fractures). If you have coughing, wheezing, and shortness of breath, those symptoms may get better when you quit. You may also get sick less often. If you are pregnant, quitting smoking can help to lower your chances of having a baby of low birth weight. What can I do to help me quit smoking? Talk with your doctor about what can help you quit smoking. Some things you can do (strategies) include:  Quitting smoking totally, instead of slowly cutting back how much you smoke over a period of time.  Going to in-person counseling. You are more likely to quit if you go to many counseling sessions.  Using resources and support systems, such as:  Online chats with a Social worker.  Phone quitlines.  Printed Furniture conservator/restorer.  Support groups or group counseling.  Text messaging programs.  Mobile phone apps or applications.  Taking medicines. Some of these medicines may have nicotine in them. If you are pregnant or breastfeeding, do not take any medicines to quit smoking unless your doctor says it is okay. Talk with your doctor about counseling or other things that can help you. Talk with your doctor about using more than  one strategy at the same time, such as taking medicines while you are also going to in-person counseling. This can help make quitting easier. What things can I do to make it easier to quit? Quitting smoking might feel very hard at first, but there is a lot that you can do to make it easier. Take these steps:  Talk to your family and friends. Ask them to support and encourage you.  Call phone quitlines, reach out to support groups, or work with a Social worker.  Ask people who smoke to not smoke around you.  Avoid places that make you want (trigger) to smoke, such as:  Bars.  Parties.  Smoke-break areas at work.  Spend time with people who do not smoke.  Lower the stress in your life. Stress can make you want to smoke. Try these things to help your stress:  Getting regular exercise.  Deep-breathing exercises.  Yoga.  Meditating.  Doing a body scan. To do this, close your eyes, focus on one area of your body at a time from head to toe, and notice which parts of your body are tense. Try to relax the muscles in those areas.  Download or buy apps on your mobile phone or tablet that can help you stick to your quit plan. There are many free apps, such as QuitGuide from the [employer] Office manager for Disease Control and Prevention). You can find more support from smokefree.gov and other websites. This information is not intended  to replace advice given to you by your health care provider. Make sure you discuss any questions you have with your health care provider. Document Released: 02/20/2009 Document Revised: 12/23/2015 Document Reviewed: 09/10/2014 Elsevier Interactive Patient Education  2017 [employer].

## 2016-04-08 NOTE — Progress Notes (Signed)
Subjective:    CC: HTN  HPI: Hypertension- Pt denies chest pain, SOB, dizziness, or heart palpitations.  Taking meds as directed w/o problems.  Denies medication side effects.  He says she still walks for exercise.  Abnormal glucose - she has been able to maintain her weight after she lost 25 lbs a couple of years ago.   Past medical history, Surgical history, Family history not pertinant except as noted below, Social history, Allergies, and medications have been entered into the medical record, reviewed, and corrections made.   Review of Systems: No fevers, chills, night sweats, weight loss, chest pain, or shortness of breath.   Objective:    General: Well Developed, well nourished, and in no acute distress.  Neuro: Alert and oriented x3, extra-ocular muscles intact, sensation grossly intact.  HEENT: Normocephalic, atraumatic  Skin: Warm and dry, no rashes. Cardiac: Regular rate and rhythm, no murmurs rubs or gallops, no lower extremity edema.  Respiratory: Clear to auscultation bilaterally. Not using accessory muscles, speaking in full sentences.   Impression and Recommendations:   HTN - Borderline elevated today.  He is due for updated lipid test. Last one was 2 years ago but was well controlled.  Abnormal glucose-hemoglobin A1c performed today. It looks fantastic at 5.2. This is actually a big improvement from last February.  Tob abuse - encouraged cessation.

## 2016-04-09 NOTE — Progress Notes (Signed)
All labs are normal. 

## 2016-04-14 ENCOUNTER — Ambulatory Visit (INDEPENDENT_AMBULATORY_CARE_PROVIDER_SITE_OTHER): Payer: BC Managed Care – PPO

## 2016-04-14 DIAGNOSIS — Z1231 Encounter for screening mammogram for malignant neoplasm of breast: Secondary | ICD-10-CM

## 2016-06-08 ENCOUNTER — Ambulatory Visit (INDEPENDENT_AMBULATORY_CARE_PROVIDER_SITE_OTHER): Payer: BC Managed Care – PPO | Admitting: Physician Assistant

## 2016-06-08 VITALS — BP 109/72 | HR 68 | Wt 226.0 lb

## 2016-06-08 DIAGNOSIS — K648 Other hemorrhoids: Secondary | ICD-10-CM

## 2016-06-08 DIAGNOSIS — K644 Residual hemorrhoidal skin tags: Secondary | ICD-10-CM | POA: Insufficient documentation

## 2016-06-08 DIAGNOSIS — K921 Melena: Secondary | ICD-10-CM | POA: Diagnosis not present

## 2016-06-08 NOTE — Patient Instructions (Signed)
- Daily stool softeners (colace) - increase dietary fiber  - Squatty potty [email or web address] - avoid straining: use the toilet when you have the urge to have a bowel movement  Hemorrhoids Hemorrhoids are swollen veins in and around the rectum or anus. There are two types of hemorrhoids:  Internal hemorrhoids. These occur in the veins that are just inside the rectum. They may poke through to the outside and become irritated and painful.  External hemorrhoids. These occur in the veins that are outside of the anus and can be felt as a painful swelling or hard lump near the anus. Most hemorrhoids do not cause serious problems, and they can be managed with home treatments such as diet and lifestyle changes. If home treatments do not help your symptoms, procedures can be done to shrink or remove the hemorrhoids. What are the causes? This condition is caused by increased pressure in the anal area. This pressure may result from various things, including:  Constipation.  Straining to have a bowel movement.  Diarrhea.  Pregnancy.  Obesity.  Sitting for long periods of time.  Heavy lifting or other activity that causes you to strain.  Anal sex. What are the signs or symptoms? Symptoms of this condition include:  Pain.  Anal itching or irritation.  Rectal bleeding.  Leakage of stool (feces).  Anal swelling.  One or more lumps around the anus. How is this diagnosed? This condition can often be diagnosed through a visual exam. Other exams or tests may also be done, such as:  Examination of the rectal area with a gloved hand (digital rectal exam).  Examination of the anal canal using a small tube (anoscope).  A blood test, if you have lost a significant amount of blood.  A test to look inside the colon (sigmoidoscopy or colonoscopy). How is this treated? This condition can usually be treated at home. However, various  procedures may be done if dietary changes, lifestyle changes, and other home treatments do not help your symptoms. These procedures can help make the hemorrhoids smaller or remove them completely. Some of these procedures involve surgery, and others do not. Common procedures include:  Rubber band ligation. Rubber bands are placed at the base of the hemorrhoids to cut off the blood supply to them.  Sclerotherapy. Medicine is injected into the hemorrhoids to shrink them.  Infrared coagulation. A type of light energy is used to get rid of the hemorrhoids.  Hemorrhoidectomy surgery. The hemorrhoids are surgically removed, and the veins that supply them are tied off.  Stapled hemorrhoidopexy surgery. A circular stapling device is used to remove the hemorrhoids and use staples to cut off the blood supply to them. Follow these instructions at home: Eating and drinking  Eat foods that have a lot of fiber in them, such as whole grains, beans, nuts, fruits, and vegetables. Ask your health care provider about taking products that have added fiber (fiber supplements).  Drink enough fluid to keep your urine clear or pale yellow. Managing pain and swelling  Take warm sitz baths for 20 minutes, 3-4 times a day to ease pain and discomfort.  If directed, apply ice to the affected area. Using ice packs between sitz baths may be helpful.  Put ice in a plastic bag.  Place a towel between your skin and the bag.  Leave the ice on for 20 minutes, 2-3 times a day. General instructions  Take over-the-counter and prescription medicines only as told by your health care provider.  Use medicated  creams or suppositories as told.  Exercise regularly.  Go to the bathroom when you have the urge to have a bowel movement. Do not wait.  Avoid straining to have bowel movements.  Keep the anal area dry and clean. Use wet toilet paper or moist towelettes after a bowel movement.  Do not sit on the toilet for long  periods of time. This increases blood pooling and pain. Contact a health care provider if:  You have increasing pain and swelling that are not controlled by treatment or medicine.  You have uncontrolled bleeding.  You have difficulty having a bowel movement, or you are unable to have a bowel movement.  You have pain or inflammation outside the area of the hemorrhoids. This information is not intended to replace advice given to you by your health care provider. Make sure you discuss any questions you have with your health care provider. Document Released: 04/23/2000 Document Revised: 09/24/2015 Document Reviewed: 01/08/2015 Elsevier Interactive Patient Education  2017 [employer].   High-Fiber Diet Fiber, also called dietary fiber, is a type of carbohydrate found in fruits, vegetables, whole grains, and beans. A high-fiber diet can have many health benefits. Your health care provider may recommend a high-fiber diet to help:  Prevent constipation. Fiber can make your bowel movements more regular.  Lower your cholesterol.  Relieve hemorrhoids, uncomplicated diverticulosis, or irritable bowel syndrome.  Prevent overeating as part of a weight-loss plan.  Prevent heart disease, type 2 diabetes, and certain cancers. What is my plan? The recommended daily intake of fiber includes:  38 grams for men under age 6.  31 grams for men over age 19.  93 grams for women under age 53.  57 grams for women over age 57. You can get the recommended daily intake of dietary fiber by eating a variety of fruits, vegetables, grains, and beans. Your health care provider may also recommend a fiber supplement if it is not possible to get enough fiber through your diet. What do I need to know about a high-fiber diet?  Fiber supplements have not been widely studied for their effectiveness, so it is better to get fiber through food sources.  Always check the fiber content on thenutrition facts label of  any prepackaged food. Look for foods that contain at least 5 grams of fiber per serving.  Ask your dietitian if you have questions about specific foods that are related to your condition, especially if those foods are not listed in the following section.  Increase your daily fiber consumption gradually. Increasing your intake of dietary fiber too quickly may cause bloating, cramping, or gas.  Drink plenty of water. Water helps you to digest fiber. What foods can I eat? Grains  Whole-grain breads. Multigrain cereal. Oats and oatmeal. Brown rice. Barley. Bulgur wheat. [place]. Bran muffins. Popcorn. Rye wafer crackers. Vegetables  Sweet potatoes. Spinach. Kale. Artichokes. Cabbage. Broccoli. Green peas. Carrots. Squash. Fruits  Berries. Pears. Apples. Oranges. Avocados. Prunes and raisins. Dried figs. Meats and Other Protein Sources  [place], kidney, pinto, and soy beans. Split peas. Lentils. Nuts and seeds. Dairy  Fiber-fortified yogurt. Beverages  Fiber-fortified soy milk. Fiber-fortified orange juice. Other  Fiber bars. The items listed above may not be a complete list of recommended foods or beverages. Contact your dietitian for more options.  What foods are not recommended? Grains  White bread. Pasta made with refined flour. White rice. Vegetables  Fried potatoes. Canned vegetables. Well-cooked vegetables. Fruits  Fruit juice. Cooked, strained fruit. Meats and  Other Protein Sources  Fatty cuts of meat. Fried Sales executive or fried fish. Dairy  Milk. Yogurt. Cream cheese. Sour cream. Beverages  Soft drinks. Other  Cakes and pastries. Butter and oils. The items listed above may not be a complete list of foods and beverages to avoid. Contact your dietitian for more information.  What are some tips for including high-fiber foods in my diet?  Eat a wide variety of high-fiber foods.  Make sure that half of all grains consumed each day are whole grains.  Replace breads and cereals made  from refined flour or white flour with whole-grain breads and cereals.  Replace white rice with brown rice, bulgur wheat, or millet.  Start the day with a breakfast that is high in fiber, such as a cereal that contains at least 5 grams of fiber per serving.  Use beans in place of meat in soups, salads, or pasta.  Eat high-fiber snacks, such as berries, raw vegetables, nuts, or popcorn. This information is not intended to replace advice given to you by your health care provider. Make sure you discuss any questions you have with your health care provider. Document Released: 04/26/2005 Document Revised: 10/02/2015 Document Reviewed: 10/09/2013 Elsevier Interactive Patient Education  2017 [employer].

## 2016-06-08 NOTE — Progress Notes (Signed)
HPI:                                                                Kimberly Gamble is a 46 y.o. female who presents to [place] today for [facility]  Patient reports 4 episodes of hematochezia on Sunday. Patient states on Saturday she had a normal breakfast of bacon and eggs. When she went to bathroom reports straining for 20 minutes, passing a hard stool followed by diarrhea. Patient endorses anorexia, abdominal cramping, diarrhea, and bright red blood in the toilet bowl throughout the day on Sunday. She denies tenesmus, nausea, vomiting. Denies fever, chills, nightsweats. She has not had any diarrhea or cramping since Sunday. Her appetite has returned.   Reports that in the last month she has felt more constipated and has approximately 1 bowel movement every 3 days. She has a history of external hemorrhoids.  Family history significant for IBS in her mother. No family history of colon cancer.   Past Medical History:  Diagnosis Date  . HLD (hyperlipidemia)    mild  . HTN (hypertension)    benign   Past Surgical History:  Procedure Laterality Date  . caesarean section    . CESAREAN SECTION    . TUBAL LIGATION     Social History  Substance Use Topics  . Smoking status: Current Every Day Smoker    Packs/day: 0.30    Years: 15.00    Types: Cigarettes  . Smokeless tobacco: Not on file     Comment: 1/2 ppd x 15+ years.   . Alcohol use No   family history includes Heart attack in her other; Stroke in her other.  Review of Systems  Constitutional: Negative for chills, diaphoresis, fever, malaise/fatigue and weight loss.  Gastrointestinal: Positive for blood in stool, constipation and diarrhea. Negative for abdominal pain, nausea and vomiting.     Medications: Current Outpatient Prescriptions  Medication Sig Dispense Refill  . lisinopril-hydrochlorothiazide (PRINZIDE,ZESTORETIC) 20-12.5 MG tablet Take 1 tablet by mouth daily. 90  tablet 0   No current facility-administered medications for this visit.    No Known Allergies     Objective:  BP 109/72   Pulse 68   Wt 226 lb (102.5 kg)   BMI 37.61 kg/m  Gen: well-groomed, cooperative, not ill-appearing, no distress Pulm: Normal work of breathing, normal phonation, clear to auscultation bilaterally, no wheezes, rales or rhonchi CV: Normal rate, regular rhythm, s1 and s2 distinct, no murmurs, clicks or rubs  GI: soft, obese, hypoactive bowel sounds, nondistended, nontender, anus with multiple nontender, non-thrombosed external hemorrhoids, Hemoccult negative, anoscopy reveals nonbleeding internal hemorrhoid at the 6 o'clock position Skin: warm and dry   No results found for this or any previous visit (from the past 72 hour(s)). No results found.    Assessment and Plan: 46 y.o. female with   Internal hemorrhoids without complication - Daily stool softeners (colace) - increase dietary fiber  - Squatty potty - avoid straining - if no improvement of constipation with stool softeners and dietary fiber in one month, consider adding daily Linzess   Patient education and anticipatory guidance given Patient agrees with treatment plan Follow-up with PCP in 4 weeks  Darlyne Russian PA-C

## 2016-07-02 ENCOUNTER — Other Ambulatory Visit: Payer: Self-pay | Admitting: Family Medicine

## 2016-10-05 ENCOUNTER — Other Ambulatory Visit: Payer: Self-pay | Admitting: Family Medicine

## 2016-10-13 ENCOUNTER — Encounter: Payer: Self-pay | Admitting: Family Medicine

## 2016-10-13 ENCOUNTER — Ambulatory Visit (INDEPENDENT_AMBULATORY_CARE_PROVIDER_SITE_OTHER): Payer: BC Managed Care – PPO | Admitting: Family Medicine

## 2016-10-13 ENCOUNTER — Other Ambulatory Visit (HOSPITAL_COMMUNITY)
Admission: RE | Admit: 2016-10-13 | Discharge: 2016-10-13 | Disposition: A | Discharge status: Home / Self Care | Payer: BC Managed Care – PPO | Source: Ambulatory Visit | Attending: Family Medicine | Admitting: Family Medicine

## 2016-10-13 VITALS — BP 123/63 | HR 69 | Ht 64.57 in | Wt 233.0 lb

## 2016-10-13 DIAGNOSIS — Z0000 Encounter for general adult medical examination without abnormal findings: Secondary | ICD-10-CM | POA: Insufficient documentation

## 2016-10-13 DIAGNOSIS — I10 Essential (primary) hypertension: Secondary | ICD-10-CM | POA: Insufficient documentation

## 2016-10-13 LAB — BASIC METABOLIC PANEL WITH GFR
BUN: 10 mg/dL (ref 7–25)
CHLORIDE: 104 mmol/L (ref 98–110)
CO2: 23 mmol/L (ref 20–31)
Calcium: 9 mg/dL (ref 8.6–10.2)
Creat: 0.76 mg/dL (ref 0.50–1.10)
GFR, Est African American: >89 mL/min (ref >=60)
GFR, Est Non African American: >89 mL/min (ref >=60)
Glucose, Bld: 83 mg/dL (ref 65–99)
Potassium: 3.8 mmol/L (ref 3.5–5.3)
Sodium: 137 mmol/L (ref 135–146)

## 2016-10-13 MED ORDER — LISINOPRIL-HYDROCHLOROTHIAZIDE 20-12.5 MG PO TABS: 1.0000 | ORAL_TABLET | Freq: Every day | ORAL | 6 refills | Status: DC

## 2016-10-13 NOTE — Addendum Note (Signed)
Addended by: Teddy Spike on: 10/13/2016 11:08 AM   Modules accepted: Orders

## 2016-10-13 NOTE — Progress Notes (Addendum)
Subjective:     Kimberly Gamble is a 46 y.o. female and is here for a comprehensive physical exam. The patient reports no problems.  Hypertension- Pt denies chest pain, SOB, dizziness, or heart palpitations.  Taking meds as directed w/o problems.  Denies medication side effects.    She is a she was seen in our office earlier this year at the end of January. She was diagnosed with hemorrhoids and has been eating high fiber diet since then has not had any more recurrence of problems.   Social History   Social History  . Marital status: Single    Spouse name: N/A  . Number of children: N/A  . Years of education: N/A   Occupational History  . Recruitment consultant for school      Social History Main Topics  . Smoking status: Current Every Day Smoker    Packs/day: 0.30    Years: 15.00    Types: Cigarettes  . Smokeless tobacco: Never Used     Comment: 1/2 ppd x 15+ years.   . Alcohol use No  . Drug use: No  . Sexual activity: Not on file   Other Topics Concern  . Not on file   Social History Narrative   Regularly exercises 3 days per week. 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 GED. In college. Has been walking for exercise.     Health Maintenance  Topic Date Due  . HIV Screening  07/16/1985  . INFLUENZA VACCINE  04/08/2017 (Originally 12/08/2016)  . PAP SMEAR  06/05/2017  . TETANUS/TDAP  04/14/2021    The following portions of the patient's history were reviewed and updated as appropriate: allergies, current medications, past family history, past medical history, past social history, past surgical history and problem list.  Review of Systems A comprehensive review of systems was negative.   Objective:    BP 123/63   Pulse 69   Ht 5' 4.57" (1.64 m)   Wt 233 lb (105.7 kg)   SpO2 99%   BMI 39.30 kg/m  General appearance: alert, cooperative and appears stated age Head: Normocephalic, without obvious abnormality, atraumatic Eyes: conj clear, EOMi, PEERLA Ears: normal TM's and  external ear canals both ears Nose: Nares normal. Septum midline. Mucosa normal. No drainage or sinus tenderness. Throat: lips, mucosa, and tongue normal; teeth and gums normal Neck: no adenopathy, no carotid bruit, no JVD, supple, symmetrical, trachea midline and thyroid not enlarged, symmetric, no tenderness/mass/nodules Back: symmetric, no curvature. ROM normal. No CVA tenderness. Lungs: clear to auscultation bilaterally Breasts: normal appearance, no masses or tenderness Heart: regular rate and rhythm, S1, S2 normal, no murmur, click, rub or gallop Abdomen: soft, non-tender; bowel sounds normal; no masses,  no organomegaly Pelvic: cervix normal in appearance, external genitalia normal, no adnexal masses or tenderness, no cervical motion tenderness, rectovaginal septum normal, uterus normal size, shape, and consistency and vagina normal without discharge Extremities: extremities normal, atraumatic, no cyanosis or edema Pulses: 2+ and symmetric Skin: Skin color, texture, turgor normal. No rashes or lesions Lymph nodes: Cervical, supraclavicular, and axillary nodes normal. Neurologic: Alert and oriented X 3, normal strength and tone. Normal symmetric reflexes. Normal coordination and gait    Assessment:    Healthy female exam.      Plan:     See After Visit Summary for Counseling Recommendations   Keep up a regular exercise program and make sure you are eating a healthy diet Try to eat 4 servings of dairy a day, or if  you are lactose intolerant take a calcium with vitamin D daily.  Your vaccines are up to date.   HTN - Well controlled. Continue current regimen. Follow up in  6 months.

## 2016-10-14 NOTE — Progress Notes (Signed)
All labs are normal. 

## 2016-10-19 LAB — CYTOLOGY - PAP
Diagnosis: NEGATIVE
HPV: NOT DETECTED

## 2016-10-19 NOTE — Progress Notes (Signed)
Call patient: Your Pap smear is normal. Repeat in 5 years.

## 2017-05-19 ENCOUNTER — Encounter: Payer: Self-pay | Admitting: Family Medicine

## 2017-05-19 ENCOUNTER — Ambulatory Visit: Payer: BC Managed Care – PPO | Admitting: Family Medicine

## 2017-05-19 VITALS — BP 130/82 | HR 76 | Ht 65.0 in | Wt 269.0 lb

## 2017-05-19 DIAGNOSIS — H938X1 Other specified disorders of right ear: Secondary | ICD-10-CM | POA: Diagnosis not present

## 2017-05-19 DIAGNOSIS — I10 Essential (primary) hypertension: Secondary | ICD-10-CM

## 2017-05-19 NOTE — Progress Notes (Signed)
Subjective:    CC: Bp  HPI:  Hypertension- Pt denies chest pain, SOB, dizziness, or heart palpitations.  Taking meds as directed w/o problems.  Denies medication side effects.    Her only concern is that she is been hearing her heartbeat in her right ear for a couple months now.  Happens almost every day.  She notices it more when she is looking straight ahead or if she actually turns her head to the left.  She has had some intermittent pain.  No fevers chills or discharge or cold symptoms.  She says about 4 weeks ago she actually had a little bit of swelling behind her right ear but that has resolved.  Past medical history, Surgical history, Family history not pertinant except as noted below, Social history, Allergies, and medications have been entered into the medical record, reviewed, and corrections made.   Review of Systems: No fevers, chills, night sweats, weight loss, chest pain, or shortness of breath.   Objective:    General: Well Developed, well nourished, and in no acute distress.  Neuro: Alert and oriented x3, extra-ocular muscles intact, sensation grossly intact.  HEENT: Normocephalic, atraumatic tympanic membranes and canals are clear bilaterally.  No significant cervical lymphadenopathy. Skin: Warm and dry, no rashes. Cardiac: Regular rate and rhythm, no murmurs rubs or gallops, no lower extremity edema.  No carotid bruit on exam. Respiratory: Clear to auscultation bilaterally. Not using accessory muscles, speaking in full sentences.   Impression and Recommendations:    HTN -due for CMP and lipid panel. Well controlled. Continue current regimen. Follow up in  6 months.    Heartbeat in her ear, right-likely benign and reassured patient.  But since it has been going on for several months and she is concerned we will get a carotid Doppler just for further evaluation though I am not hearing any bruit on exam.  Does have her mammogram scheduled for sometime next week.

## 2017-05-20 ENCOUNTER — Other Ambulatory Visit: Payer: Self-pay | Admitting: *Deleted

## 2017-05-20 DIAGNOSIS — I10 Essential (primary) hypertension: Secondary | ICD-10-CM

## 2017-05-20 LAB — COMPLETE METABOLIC PANEL WITH GFR
AG Ratio: 1.5 (calc) (ref 1.0–2.5)
ALBUMIN MSPROF: 4.1 g/dL (ref 3.6–5.1)
ALKALINE PHOSPHATASE (APISO): 56 U/L (ref 33–115)
ALT: 15 U/L (ref 6–29)
AST: 19 U/L (ref 10–35)
BUN: 12 mg/dL (ref 7–25)
CO2: 29 mmol/L (ref 20–32)
CREATININE: 0.65 mg/dL (ref 0.50–1.10)
Calcium: 9.1 mg/dL (ref 8.6–10.2)
Chloride: 101 mmol/L (ref 98–110)
GFR, Est African American: 123 mL/min/{1.73_m2} (ref >=60)
GFR, Est Non African American: 106 mL/min/{1.73_m2} (ref >=60)
GLUCOSE: 82 mg/dL (ref 65–99)
Globulin: 2.8 g/dL (calc) (ref 1.9–3.7)
Potassium: 3.3 mmol/L — ABNORMAL LOW (ref 3.5–5.3)
Sodium: 137 mmol/L (ref 135–146)
Total Bilirubin: 0.4 mg/dL (ref 0.2–1.2)
Total Protein: 6.9 g/dL (ref 6.1–8.1)

## 2017-05-20 LAB — CBC
HEMATOCRIT: 30.7 % — AB (ref 35.0–45.0)
Hemoglobin: 9.9 g/dL — ABNORMAL LOW (ref 11.7–15.5)
MCH: 23.5 pg — AB (ref 27.0–33.0)
MCHC: 32.2 g/dL (ref 32.0–36.0)
MCV: 72.9 fL — AB (ref 80.0–100.0)
MPV: 10.2 fL (ref 7.5–12.5)
Platelets: 329 10*3/uL (ref 140–400)
RBC: 4.21 10*6/uL (ref 3.80–5.10)
RDW: 15.7 % — AB (ref 11.0–15.0)
WBC: 4.6 10*3/uL (ref 3.8–10.8)

## 2017-05-20 LAB — LIPID PANEL
CHOL/HDL RATIO: 3.8 (calc) (ref <5.0)
CHOLESTEROL: 202 mg/dL — AB (ref <200)
HDL: 53 mg/dL (ref >50)
LDL Cholesterol (Calc): 124 mg/dL (calc) — ABNORMAL HIGH
Non-HDL Cholesterol (Calc): 149 mg/dL (calc) — ABNORMAL HIGH (ref <130)
TRIGLYCERIDES: 132 mg/dL (ref <150)

## 2017-05-20 LAB — FERRITIN: Ferritin: 9 ng/mL — ABNORMAL LOW (ref 10–232)

## 2017-05-20 LAB — TSH: TSH: 1.13 mIU/L

## 2017-05-20 MED ORDER — LISINOPRIL-HYDROCHLOROTHIAZIDE 20-12.5 MG PO TABS: 1.0000 | ORAL_TABLET | Freq: Every day | ORAL | 6 refills | Status: DC

## 2017-05-25 ENCOUNTER — Telehealth: Payer: Self-pay | Admitting: Family Medicine

## 2017-05-25 NOTE — Telephone Encounter (Signed)
-----   Message from Katha Hamming sent at 05/25/2017 10:38 AM EST ----- Regarding: US carotid Jamilet called and cancelled her ultrasound for tomorrow.   She didn't want to r/s.  Thanks, Hoyle Sauer

## 2017-05-26 ENCOUNTER — Ambulatory Visit (HOSPITAL_BASED_OUTPATIENT_CLINIC_OR_DEPARTMENT_OTHER): Payer: BC Managed Care – PPO

## 2017-06-24 ENCOUNTER — Telehealth: Payer: Self-pay

## 2017-06-24 DIAGNOSIS — Z6841 Body Mass Index (BMI) 40.0 and over, adult: Secondary | ICD-10-CM

## 2017-06-24 NOTE — Telephone Encounter (Signed)
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 They advised her to call her PCP for an order. Please advise.

## 2017-06-24 NOTE — Telephone Encounter (Signed)
Study ordered

## 2017-06-27 NOTE — Telephone Encounter (Signed)
Left message advising of the study being ordered.

## 2017-08-03 ENCOUNTER — Ambulatory Visit (HOSPITAL_BASED_OUTPATIENT_CLINIC_OR_DEPARTMENT_OTHER): Payer: BC Managed Care – PPO | Attending: Family Medicine | Admitting: Internal Medicine

## 2017-08-03 VITALS — Ht 65.0 in | Wt 240.0 lb

## 2017-08-03 DIAGNOSIS — G4733 Obstructive sleep apnea (adult) (pediatric): Secondary | ICD-10-CM | POA: Insufficient documentation

## 2017-08-03 DIAGNOSIS — Z6841 Body Mass Index (BMI) 40.0 and over, adult: Secondary | ICD-10-CM | POA: Diagnosis present

## 2017-08-03 DIAGNOSIS — G4761 Periodic limb movement disorder: Secondary | ICD-10-CM | POA: Insufficient documentation

## 2017-08-13 DIAGNOSIS — G4733 Obstructive sleep apnea (adult) (pediatric): Secondary | ICD-10-CM | POA: Diagnosis not present

## 2017-08-13 NOTE — Procedures (Signed)
    Patient Name: Kimberly Gamble, Kimberly Gamble Date: 08/03/2017 Gender: Female D.O.B: 08/17/1970 Age (years): 47 Referring Provider: Beatrice Lecher Height (inches): 65 Interpreting Physician: Baird Lyons MD, ABSM Weight (lbs): 240 RPSGT: Carolin Coy BMI: 40 MRN: [id number] Neck Size: 15.50 <br> <br> CLINICAL INFORMATION Sleep Study Type: NPSG  Indication for sleep study: Hypertension, Obesity  Epworth Sleepiness Score: 4  SLEEP STUDY TECHNIQUE As per the AASM Manual for the Scoring of Sleep and Associated Events v2.3 (April 2016) with a hypopnea requiring 4% desaturations.  The channels recorded and monitored were frontal, central and occipital EEG, electrooculogram (EOG), submentalis EMG (chin), nasal and oral airflow, thoracic and abdominal wall motion, anterior tibialis EMG, snore microphone, electrocardiogram, and pulse oximetry.  MEDICATIONS Medications self-administered by patient taken the night of the study : none reported  SLEEP ARCHITECTURE The study was initiated at 11:01:16 PM and ended at 5:04:16 AM.  Sleep onset time was 31.2 minutes and the sleep efficiency was 50.1%%. The total sleep time was 182.0 minutes.  Stage REM latency was 60.5 minutes.  The patient spent 23.4%% of the night in stage N1 sleep, 70.9%% in stage N2 sleep, 0.0%% in stage N3 and 5.77% in REM.  Alpha intrusion was absent.  Supine sleep was 0.00%.  RESPIRATORY PARAMETERS The overall apnea/hypopnea index (AHI) was 12.2 per hour. There were 6 total apneas, including 4 obstructive, 1 central and 1 mixed apneas. There were 31 hypopneas and 33 RERAs.  The AHI during Stage REM sleep was 91.4 per hour.  AHI while supine was N/A per hour.  The mean oxygen saturation was 96.6%. The minimum SpO2 during sleep was 85.0%.  moderate snoring was noted during this study.  CARDIAC DATA The 2 lead EKG demonstrated sinus rhythm. The mean heart rate was 72.7 beats per minute. Other EKG findings  include: PVCs.  LEG MOVEMENT DATA The total PLMS were 47. Associated arousal with leg movement index was 9.6/ hr .  IMPRESSIONS - Mild obstructive sleep apnea occurred during this study (AHI = 12.2/h). - Insufficient early events and sleep to meet protocol requirements for split CPAP titration. - No significant central sleep apnea occurred during this study (CAI = 0.3/h). - Mild oxygen desaturation was noted during this study (Min O2 = 85.0%). - The patient snored with moderate snoring volume. - EKG findings include PVCs, PACs. - Mild periodic limb movements did occur during sleep. Associated arousals were mild, and may be insignificant when treatnment for OSA reduces stimulation.  DIAGNOSIS - Obstructive Sleep Apnea (327.23 [G47.33 ICD-10]) - Periodic Limb Movement During Sleep (327.51 [G47.61 ICD-10])  RECOMMENDATIONS - CPAP titration study, autopap, or a fitted oral appliance. Other options would be based on clinical judgment. - Mirapex, Requip, or Sinemet for might be considered as treatment for Periodic Leg Movements of Sleep if needed. - Sleep hygiene should be reviewed to assess factors that may improve sleep quality. - Weight management and regular exercise should be initiated or continued if appropriate.  [Electronically signed] 08/13/2017 12:05 PM  Baird Lyons MD, [place], American Board of Sleep Medicine   NPI: [id number]                         [place], [place] [facility]  ELECTRONICALLY SIGNED ON:  08/13/2017, 11:52 AM [place] PH: [phone]   FX: [phone] [place]

## 2017-09-08 ENCOUNTER — Ambulatory Visit: Payer: BC Managed Care – PPO | Admitting: Family Medicine

## 2017-09-08 ENCOUNTER — Encounter: Payer: Self-pay | Admitting: Family Medicine

## 2017-09-08 VITALS — BP 137/73 | HR 70 | Ht 65.0 in | Wt 271.0 lb

## 2017-09-08 DIAGNOSIS — G4733 Obstructive sleep apnea (adult) (pediatric): Secondary | ICD-10-CM | POA: Diagnosis not present

## 2017-09-08 DIAGNOSIS — G4761 Periodic limb movement disorder: Secondary | ICD-10-CM

## 2017-09-08 MED ORDER — ROPINIROLE HCL 0.25 MG PO TABS: 0.2500 mg | ORAL_TABLET | Freq: Every day | ORAL | 3 refills | Status: DC

## 2017-09-08 NOTE — Patient Instructions (Signed)
We will work on getting you set up for CPAP.  They should be contacting you to schedule delivery of the machine.

## 2017-09-08 NOTE — Progress Notes (Signed)
Subjective:    Patient ID: Kimberly Gamble, female    DOB: [birth year], 47 y.o.   MRN: [id number]  HPI 47 year old female is here today to follow-up on her recent sleep study that was performed at the [facility] long sleep lab.  Dr. Baird Lyons 1 of our pulmonologist read her study.  Her BMI is 45.  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  Did snore moderately.  And oxygen dropped down to 85% while asleep.  Predominantly a side sleeper.  Also noted to have significant periodic limb movement that caused 9.6 arousals per hour.    Review of Systems BP 137/73   Pulse 70   Ht 5\' 5"  (1.651 m)   Wt 271 lb (122.9 kg)   BMI 45.10 kg/m     No Known Allergies  Past Medical History:  Diagnosis Date  . HLD (hyperlipidemia)    mild  . HTN (hypertension)    benign    Past Surgical History:  Procedure Laterality Date  . caesarean section    . CESAREAN SECTION    . TUBAL LIGATION      Social History   Socioeconomic History  . Marital status: Married    Spouse name: Not on file  . Number of children: Not on file  . Years of education: Not on file  . Highest education level: Not on file  Occupational History  . Occupation: Recruitment consultant for school    Social Needs  . Financial resource strain: Not on file  . Food insecurity:    Worry: Not on file    Inability: Not on file  . Transportation needs:    Medical: Not on file    Non-medical: Not on file  Tobacco Use  . Smoking status: Current Every Day Smoker    Packs/day: 0.30    Years: 15.00    Pack years: 4.50    Types: Cigarettes  . Smokeless tobacco: Never Used  . Tobacco comment: 1/2 ppd x 15+ years.   Substance and Sexual Activity  . Alcohol use: No    Alcohol/week: 0.0 oz  . Drug use: No  . Sexual activity: Not on file  Lifestyle  . Physical activity:    Days per week: Not on file    Minutes per session: Not on file  . Stress: Not on file  Relationships  . Social connections:    Talks on phone: Not on  file    Gets together: Not on file    Attends religious service: Not on file    Active member of club or organization: Not on file    Attends meetings of clubs or organizations: Not on file    Relationship status: Not on file  . Intimate partner violence:    Fear of current or ex partner: Not on file    Emotionally abused: Not on file    Physically abused: Not on file    Forced sexual activity: Not on file  Other Topics Concern  . Not on file  Social History Narrative   Regularly exercises 3 days per week. 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 GED. In college. Has been walking for exercise.      Family History  Problem Relation Age of Onset  . Heart attack Other   . Stroke Other     Outpatient Encounter Medications as of 09/08/2017  Medication Sig  . lisinopril-hydrochlorothiazide (PRINZIDE,ZESTORETIC) 20-12.5 MG tablet Take 1 tablet by mouth daily.  Marland Kitchen rOPINIRole (  REQUIP) 0.25 MG tablet Take 1-2 tablets (0.25-0.5 mg total) by mouth at bedtime.   No facility-administered encounter medications on file as of 09/08/2017.          Objective:   Physical Exam  Constitutional: She is oriented to person, place, and time. She appears well-developed and well-nourished.  HENT:  Head: Normocephalic and atraumatic.  Eyes: Conjunctivae and EOM are normal.  Cardiovascular: Normal rate.  Pulmonary/Chest: Effort normal.  Neurological: She is alert and oriented to person, place, and time.  Skin: Skin is dry. No pallor.  Psychiatric: She has a normal mood and affect. Her behavior is normal.  Vitals reviewed.         Assessment & Plan:  Obstructive sleep apnea-New dx.  apnea hHYPOPNEA index of 12/h.  This puts her into the mild category.  She did not sleep long enough to have the second part of the split titration study.  We will get her set up on home CPAP per our discussion and have it set for AutoPap for the first week and then get a download and adjust her pressure.  I will see her back  in 1 month so that we can troubleshoot any issues that she may be having and to make sure that she is wearing as consistently as possible.  We discussed the risks of not treating her sleep apnea and the benefits of treating it.  If intolerant to CPAP we could consider an oral appliance.  Also discussed the importance of working on weight loss.  periodic limb movement-new diagnosis.  Discussed treatment options.  Will start ropinirole.  Time spent 25 minutes, greater than 50% of the time spent discussing sleep apnea and periodic limb movement.

## 2017-09-27 ENCOUNTER — Telehealth: Payer: Self-pay | Admitting: Family Medicine

## 2017-09-27 DIAGNOSIS — G4733 Obstructive sleep apnea (adult) (pediatric): Secondary | ICD-10-CM

## 2017-09-27 MED ORDER — AMBULATORY NON FORMULARY MEDICATION: 0 refills | Status: DC

## 2017-09-27 NOTE — Telephone Encounter (Signed)
Printed Rx given to [employer]. She will complete form to send to DME company ([employer]).Kimberly Gamble, [place]

## 2017-09-27 NOTE — Telephone Encounter (Signed)
Order placed. I apologize °

## 2017-09-29 ENCOUNTER — Other Ambulatory Visit: Payer: Self-pay | Admitting: *Deleted

## 2017-11-04 ENCOUNTER — Ambulatory Visit: Payer: BC Managed Care – PPO | Admitting: Family Medicine

## 2017-11-04 ENCOUNTER — Encounter: Payer: Self-pay | Admitting: Family Medicine

## 2017-11-04 VITALS — BP 138/75 | HR 85 | Ht 65.0 in | Wt 275.0 lb

## 2017-11-04 DIAGNOSIS — G4761 Periodic limb movement disorder: Secondary | ICD-10-CM | POA: Diagnosis not present

## 2017-11-04 DIAGNOSIS — G4733 Obstructive sleep apnea (adult) (pediatric): Secondary | ICD-10-CM | POA: Diagnosis not present

## 2017-11-04 DIAGNOSIS — J301 Allergic rhinitis due to pollen: Secondary | ICD-10-CM | POA: Diagnosis not present

## 2017-11-04 DIAGNOSIS — Z6841 Body Mass Index (BMI) 40.0 and over, adult: Secondary | ICD-10-CM | POA: Diagnosis not present

## 2017-11-04 NOTE — Progress Notes (Signed)
Subjective:    CC:   HPI:  F/U new start CPAP for OSA -she is actually been doing really well with the CPAP.  She ended up choosing the nasal pillows instead of the full facemask.  She has been wearing it consistently every single night.  She really only had some problems the second night she work where she accidentally got the tube wrapped around her neck.  Has been waking up with some headaches in the morning but feels like it is coming from her sinuses.  She is had a lot of congestion and sneezing.  She is not currently taking anything for allergies.  It is worse when she goes outside.  F/U RLS -that she is doing really well on the ropinirole.  She is only been taking 1 tab and that seems to be controlling her restless leg symptoms well.  She also wanted to discuss her obesity and possible referral for surgical consultation for bariatric surgery.  She says she is at the point where she is tried dieting in the past and feels like it is just not working well for her.  She says she does not want to become stuck in a situation like her mother where she is obese and needs surgery and cannot have it because of the obesity.  Past medical history, Surgical history, Family history not pertinant except as noted below, Social history, Allergies, and medications have been entered into the medical record, reviewed, and corrections made.   Review of Systems: No fevers, chills, night sweats, weight loss, chest pain, or shortness of breath.   Objective:    Physical Exam  Constitutional: She is oriented to person, place, and time. She appears well-developed and well-nourished.  HENT:  Head: Normocephalic and atraumatic.  Eyes: Conjunctivae and EOM are normal.  Cardiovascular: Normal rate.  Pulmonary/Chest: Effort normal.  Neurological: She is alert and oriented to person, place, and time.  Skin: Skin is dry. No pallor.  Psychiatric: She has a normal mood and affect. Her behavior is normal.  Vitals  reviewed.     Impression and Recommendations:    OSA -she is doing well and has been using it very consistently.  We will call the company to get a download so that I can set her CPAP.  I like to see her back in about 3 months after that to make sure that she is doing well.  She is having any technical problems encouraged her to contact the company directly.  Sinus congestion/allergic rhinitis-recommend a trial of an over-the-counter antihistamine without a decongestant or nasal steroid spray.  Restless leg syndrome-continue her Purinol.  Just taking 1 tab daily.  Obesity/morbid/BMI greater than 40-we discussed options including medical management versus surgical management.  She is more interested in surgical management so we will refer her for seminar and then follow-up with the surgeon.

## 2017-11-29 ENCOUNTER — Other Ambulatory Visit: Payer: Self-pay | Admitting: Family Medicine

## 2017-11-29 DIAGNOSIS — I10 Essential (primary) hypertension: Secondary | ICD-10-CM

## 2017-12-28 ENCOUNTER — Other Ambulatory Visit: Payer: Self-pay | Admitting: Family Medicine

## 2017-12-28 DIAGNOSIS — I10 Essential (primary) hypertension: Secondary | ICD-10-CM

## 2018-02-06 ENCOUNTER — Ambulatory Visit: Payer: BC Managed Care – PPO | Admitting: Family Medicine

## 2018-02-26 ENCOUNTER — Other Ambulatory Visit: Payer: Self-pay | Admitting: Family Medicine

## 2018-02-26 DIAGNOSIS — I10 Essential (primary) hypertension: Secondary | ICD-10-CM

## 2018-03-30 ENCOUNTER — Other Ambulatory Visit: Payer: Self-pay | Admitting: Family Medicine

## 2018-03-30 DIAGNOSIS — I10 Essential (primary) hypertension: Secondary | ICD-10-CM

## 2018-04-20 ENCOUNTER — Encounter: Payer: Self-pay | Admitting: Family Medicine

## 2018-04-20 ENCOUNTER — Ambulatory Visit (INDEPENDENT_AMBULATORY_CARE_PROVIDER_SITE_OTHER): Payer: BC Managed Care – PPO

## 2018-04-20 ENCOUNTER — Ambulatory Visit (INDEPENDENT_AMBULATORY_CARE_PROVIDER_SITE_OTHER): Payer: BC Managed Care – PPO | Admitting: Family Medicine

## 2018-04-20 ENCOUNTER — Other Ambulatory Visit: Payer: Self-pay | Admitting: Family Medicine

## 2018-04-20 VITALS — BP 124/65 | HR 62 | Temp 98.2°F | Ht 65.0 in | Wt 257.5 lb

## 2018-04-20 DIAGNOSIS — G8929 Other chronic pain: Secondary | ICD-10-CM

## 2018-04-20 DIAGNOSIS — M25561 Pain in right knee: Principal | ICD-10-CM

## 2018-04-20 DIAGNOSIS — B351 Tinea unguium: Secondary | ICD-10-CM

## 2018-04-20 DIAGNOSIS — Z1231 Encounter for screening mammogram for malignant neoplasm of breast: Secondary | ICD-10-CM

## 2018-04-20 DIAGNOSIS — I10 Essential (primary) hypertension: Secondary | ICD-10-CM | POA: Diagnosis not present

## 2018-04-20 DIAGNOSIS — Z6841 Body Mass Index (BMI) 40.0 and over, adult: Secondary | ICD-10-CM

## 2018-04-20 DIAGNOSIS — M1711 Unilateral primary osteoarthritis, right knee: Secondary | ICD-10-CM | POA: Diagnosis not present

## 2018-04-20 MED ORDER — TERBINAFINE HCL 250 MG PO TABS: 250.0000 mg | ORAL_TABLET | Freq: Every day | ORAL | 1 refills | Status: DC

## 2018-04-20 MED ORDER — LISINOPRIL-HYDROCHLOROTHIAZIDE 20-12.5 MG PO TABS: ORAL_TABLET | ORAL | 1 refills | Status: DC

## 2018-04-20 NOTE — Progress Notes (Signed)
Subjective:    CC:   HPI:  F/U OSA - she is doing well with CPAP.  She feels like it is fitting well she is been wearing it nightly.  She does feel like her morning headaches have resolved.  She is having right know pain x 1 year. Worse last 6 months.  Been using icy hot.  She says that it pops and swells at times.  Would actually like to restart oral Lamisil.  She still battling with fungus on her nails.  She was trying some over-the-counter techniques such as Epson salt soaks and soaking in vinegar and she thought initially it was helping but it is not.  She did actually stop the Lamisil after she read that it could cause some liver issues and was concerned.  She did contact the bariatric clinic and did the online introductory course.  She is now just waiting until January because she will be changing insurance plans before moving forward with the application process.  Past medical history, Surgical history, Family history not pertinant except as noted below, Social history, Allergies, and medications have been entered into the medical record, reviewed, and corrections made.   Review of Systems: No fevers, chills, night sweats, weight loss, chest pain, or shortness of breath.   Objective:    General: Well Developed, well nourished, and in no acute distress.  Neuro: Alert and oriented x3, extra-ocular muscles intact, sensation grossly intact.  HEENT: Normocephalic, atraumatic  Skin: Warm and dry, no rashes. Cardiac: Regular rate and rhythm, no murmurs rubs or gallops, no lower extremity edema.  Respiratory: Clear to auscultation bilaterally. Not using accessory muscles, speaking in full sentences. MSK: Right knee tender along the medial joint line.  Nontender around the patellar tendon or lateral joint line.  Some pain with full flexion but she is able to fully flex.  Some pain with valgus stress.  No crepitus with McMurray's.  Strength is 5-5 at the hip knee and ankle.   Impression and  Recommendations:    OSA -wean well on her CPAP.  Will call for download just to confirm that her AHI is less than 4.  Right knee pain -we will get x-rays for further work-up.  Suspect osteoarthritis versus meniscal tear especially with swelling and popping.  We will start with plain film x-ray and call results once available.  We also discussed the importance of weight loss.  Onychomycosis-restart oral Lamisil.  We will follow her liver enzymes carefully.  Obesity, Morbid-she is actually planning on consulting with bariatrics for possible surgery.  She did the online presentation and is just waiting until January because her insurance will be changing this year.

## 2018-04-28 ENCOUNTER — Ambulatory Visit (INDEPENDENT_AMBULATORY_CARE_PROVIDER_SITE_OTHER): Payer: BC Managed Care – PPO

## 2018-04-28 DIAGNOSIS — Z1231 Encounter for screening mammogram for malignant neoplasm of breast: Secondary | ICD-10-CM

## 2018-07-07 ENCOUNTER — Other Ambulatory Visit: Payer: Self-pay | Admitting: Family Medicine

## 2018-07-07 ENCOUNTER — Encounter: Payer: Self-pay | Admitting: Physician Assistant

## 2018-07-07 ENCOUNTER — Ambulatory Visit: Payer: BC Managed Care – PPO | Admitting: Physician Assistant

## 2018-07-07 VITALS — BP 128/80 | HR 65 | Temp 97.9°F | Wt 253.0 lb

## 2018-07-07 DIAGNOSIS — J0100 Acute maxillary sinusitis, unspecified: Secondary | ICD-10-CM

## 2018-07-07 DIAGNOSIS — J309 Allergic rhinitis, unspecified: Secondary | ICD-10-CM

## 2018-07-07 MED ORDER — LEVOCETIRIZINE DIHYDROCHLORIDE 5 MG PO TABS: 5.0000 mg | ORAL_TABLET | Freq: Every evening | ORAL | 3 refills | Status: AC

## 2018-07-07 MED ORDER — AMOXICILLIN-POT CLAVULANATE 875-125 MG PO TABS: 1.0000 | ORAL_TABLET | Freq: Two times a day (BID) | ORAL | 0 refills | Status: DC

## 2018-07-07 MED ORDER — FLUTICASONE PROPIONATE 50 MCG/ACT NA SUSP: 2.0000 | Freq: Every day | NASAL | 6 refills | Status: AC

## 2018-07-07 NOTE — Progress Notes (Signed)
HPI:                                                                Kimberly Gamble is a 48 y.o. female who presents to [facility] today for ?sinusitis  Sinus Problem  This is a new problem. The current episode started more than 1 month ago. The problem has been rapidly worsening since onset. There has been no fever. The pain is moderate. Associated symptoms include coughing (nonproductive), sinus pressure (right sided) and a sore throat (PND). Pertinent negatives include no chills, ear pain (L aural fullness), neck pain or shortness of breath. Past treatments include oral decongestants (antihistamine). The treatment provided no relief.     Past Medical History:  Diagnosis Date  . HLD (hyperlipidemia)    mild  . HTN (hypertension)    benign   Past Surgical History:  Procedure Laterality Date  . caesarean section    . CESAREAN SECTION    . TUBAL LIGATION     Social History   Tobacco Use  . Smoking status: Current Every Day Smoker    Packs/day: 0.30    Years: 15.00    Pack years: 4.50    Types: Cigarettes  . Smokeless tobacco: Never Used  . Tobacco comment: Per pt, smokes 8 cigs a day  Substance Use Topics  . Alcohol use: No    Alcohol/week: 0.0 standard drinks   family history includes Heart attack in an other family member; Stroke in an other family member.   Review of Systems  Constitutional: Positive for malaise/fatigue. Negative for chills and fever.  HENT: Positive for sinus pressure (right sided) and sore throat (PND). Negative for ear pain (L aural fullness).        + facial swelling  Respiratory: Positive for cough (nonproductive). Negative for sputum production and shortness of breath.   Musculoskeletal: Negative for neck pain.  All other systems reviewed and are negative.    Medications: Current Outpatient Medications  Medication Sig Dispense Refill  . AMBULATORY NON FORMULARY MEDICATION Medication  Name: CPAP set to autopap 4-10 cm water pressure.  Call/Fax download in one week.  Fax to 1 vial 0  . lisinopril-hydrochlorothiazide (PRINZIDE,ZESTORETIC) 20-12.5 MG tablet TAKE 1 TABLET BY MOUTH DAILY. 90 tablet 1  . rOPINIRole (REQUIP) 0.25 MG tablet Take 1-2 tablets (0.25-0.5 mg total) by mouth at bedtime. 60 tablet 3  . terbinafine (LAMISIL) 250 MG tablet Take 1 tablet (250 mg total) by mouth daily. 90 tablet 1   No current facility-administered medications for this visit.    No Known Allergies     Objective:  BP 128/80 (BP Location: Left Arm, Patient Position: Sitting, Cuff Size: Large)   Pulse 65   Temp 97.9 F (36.6 C) (Oral)   Wt 253 lb (114.8 kg)   SpO2 98%   BMI 42.10 kg/m  Gen:  alert, not ill-appearing, no distress, appropriate for age HEENT: head normocephalic without obvious abnormality, conjunctiva and cornea clear, external ear canals partially obstructed by cerumen bilaterally, unable to visualize the tympanic membranes, nasal mucosa pink, right-sided maxillary sinus tenderness, oropharynx with postnasal secretions, uvula midline, neck supple, no cervical adenopathy trachea midline Pulm: Normal work of breathing, normal phonation, clear to auscultation bilaterally, no  wheezes, rales or rhonchi CV: Normal rate, regular rhythm, s1 and s2 distinct, no murmurs, clicks or rubs  Neuro: alert and oriented x 3, no tremor MSK: extremities atraumatic, normal gait and station Skin: intact, no rashes on exposed skin, no jaundice, no cyanosis  No results found for this or any previous visit (from the past 72 hour(s)). No results found.    Assessment and Plan: 48 y.o. female with   .Diagnoses and all orders for this visit:  Subacute maxillary sinusitis -     amoxicillin-clavulanate (AUGMENTIN) 875-125 MG tablet; Take 1 tablet by mouth 2 (two) times daily.  Allergic rhinitis, unspecified seasonality, unspecified trigger -     levocetirizine (XYZAL ALLERGY 24HR) 5 MG  tablet; Take 1 tablet (5 mg total) by mouth every evening. -     fluticasone (FLONASE) 50 MCG/ACT nasal spray; Place 2 sprays into both nostrils daily.   Afebrile, no tachypnea, tachycardia, pulse ox 98% on room air at rest, persistent symptoms for greater than 2 months with sudden worsening in the last week and exquisite right-sided maxillary sinus tenderness suggestive of bacterial sinusitis, treating empirically with Augmentin for 10 days. Counseled on supportive care Recommended Xyzal and Flonase for treatment of allergic rhinitis and prevention of recurrent symptoms    Patient education and anticipatory guidance given Patient agrees with treatment plan Follow-up as needed if symptoms worsen or fail to improve  Darlyne Russian PA-C

## 2018-07-07 NOTE — Patient Instructions (Addendum)
For nasal symptoms/sinusitis: - nasal saline rinses / netti pot several times per day (do this prior to nasal spray) - warm facial compresses - oral decongestants and antihistamines like Claritin-D and Zyrtec-D may help dry up secretions (caution using decongestants if you have high blood pressure, heart disease or kidney disease) - for sinus headache: Tylenol 1000mg  every 8 hours as needed. Alternate with Ibuprofen 600mg  every 6 hours    Sinus Headache  A sinus headache occurs when your sinuses become clogged or swollen. Sinuses are air-filled spaces in your skull that are behind the bones of your face and forehead. Sinus headaches can range from mild to severe. What are the causes? A sinus headache can result from various conditions that affect the sinuses. Common causes include:  Colds.  Sinus infections.  Allergies. Many people confuse sinus headaches with migraines or tension headaches because those headaches can also cause facial pain and nasal symptoms. What are the signs or symptoms? The main symptom of this condition is a headache that may feel like pain or pressure in your face, forehead, ears, or upper teeth. People who have a sinus headache often have other symptoms, such as:  Congested or runny nose.  Fever.  Inability to smell. Weather changes can make symptoms worse. How is this diagnosed? This condition may be diagnosed based on:  A physical exam and medical history.  Imaging tests, such as a CT scan or MRI, to check for problems with the sinuses.  Examination of the sinuses using a thin tool with a camera that is inserted through your nose (endoscopy). How is this treated? Treatment for this condition depends on the cause.  Sinus pain that is caused by a sinus infection may be treated with antibiotic medicine.  Sinus pain that is caused by allergies may be helped by allergy medicines (antihistamines) and medicated nasal sprays.  Sinus pain that is caused  by congestion may be helped by rinsing out (flushing) the nose and sinuses with saline solution.  Sinus surgery may be needed in some cases if other treatments do not help. Follow these instructions at home: General instructions  If directed: ? Apply a warm, moist washcloth to your face to help relieve pain. ? Use a nasal saline wash. Medicines   Take over-the-counter and prescription medicines only as told by your health care provider.  If you were prescribed an antibiotic medicine, take it as told by your health care provider. Do not stop taking the antibiotic even if you start to feel better.  If you have congestion, use a nasal spray to help lessen pressure. Hydrate and humidify  Drink enough water to keep your urine clear or pale yellow. Staying hydrated will help to thin your mucus.  Use a cool mist humidifier to keep the humidity level in your home above 50%.  Inhale steam for 10-15 minutes, 3-4 times a day or as told by your health care provider. You can do this in the bathroom while a hot shower is running.  Limit your exposure to cool or dry air. Contact a health care provider if:  You have a headache more than one time a week.  You have sensitivity to light or sound.  You develop a fever.  You feel nauseous or you vomit.  Your headaches do not get better with treatment. Many people think that they have a sinus headache when they actually have a migraine or a tension headache. Get help right away if:  You have vision problems.  You have sudden, severe pain in your face or head.  You have a seizure.  You are confused.  You have a stiff neck. Summary  A sinus headache occurs when your sinuses become clogged or swollen.  A sinus headache can result from various conditions that affect the sinuses, such as a cold, a sinus infection, or an allergy.  Treatment for this condition depends on the cause. It may include medicine, such as antibiotics or  antihistamines. This information is not intended to replace advice given to you by your health care provider. Make sure you discuss any questions you have with your health care provider. Document Released: 06/03/2004 Document Revised: 02/04/2017 Document Reviewed: 02/04/2017 Elsevier Interactive Patient Education  2019 [employer].

## 2018-07-08 LAB — CBC
HCT: 35.5 % (ref 35.0–45.0)
Hemoglobin: 11.6 g/dL — ABNORMAL LOW (ref 11.7–15.5)
MCH: 26.4 pg — AB (ref 27.0–33.0)
MCHC: 32.7 g/dL (ref 32.0–36.0)
MCV: 80.7 fL (ref 80.0–100.0)
MPV: 10.8 fL (ref 7.5–12.5)
Platelets: 366 10*3/uL (ref 140–400)
RBC: 4.4 10*6/uL (ref 3.80–5.10)
RDW: 15 % (ref 11.0–15.0)
WBC: 4.5 10*3/uL (ref 3.8–10.8)

## 2018-07-08 LAB — COMPLETE METABOLIC PANEL WITH GFR
AG RATIO: 1.4 (calc) (ref 1.0–2.5)
ALT: 7 U/L (ref 6–29)
AST: 12 U/L (ref 10–35)
Albumin: 4.2 g/dL (ref 3.6–5.1)
Alkaline phosphatase (APISO): 54 U/L (ref 31–125)
BUN: 13 mg/dL (ref 7–25)
CO2: 26 mmol/L (ref 20–32)
Calcium: 9.6 mg/dL (ref 8.6–10.2)
Chloride: 101 mmol/L (ref 98–110)
Creat: 0.87 mg/dL (ref 0.50–1.10)
GFR, Est African American: 92 mL/min/{1.73_m2} (ref >=60)
GFR, Est Non African American: 79 mL/min/{1.73_m2} (ref >=60)
Globulin: 2.9 g/dL (calc) (ref 1.9–3.7)
Glucose, Bld: 83 mg/dL (ref 65–99)
POTASSIUM: 3.7 mmol/L (ref 3.5–5.3)
Sodium: 136 mmol/L (ref 135–146)
Total Bilirubin: 0.3 mg/dL (ref 0.2–1.2)
Total Protein: 7.1 g/dL (ref 6.1–8.1)

## 2018-07-08 LAB — TSH: TSH: 0.76 mIU/L

## 2018-07-08 LAB — LIPID PANEL
Cholesterol: 193 mg/dL (ref <200)
HDL: 52 mg/dL (ref >=50)
LDL Cholesterol (Calc): 116 mg/dL (calc) — ABNORMAL HIGH
Non-HDL Cholesterol (Calc): 141 mg/dL (calc) — ABNORMAL HIGH (ref <130)
Total CHOL/HDL Ratio: 3.7 (calc) (ref <5.0)
Triglycerides: 132 mg/dL (ref <150)

## 2018-07-26 ENCOUNTER — Telehealth: Payer: Self-pay | Admitting: *Deleted

## 2018-07-26 NOTE — Telephone Encounter (Signed)
LM on voicemail that her appt is being cancelled at this time due to the COVID-19 and we will call her to reschedule.

## 2018-08-01 ENCOUNTER — Telehealth: Payer: Self-pay | Admitting: *Deleted

## 2018-08-01 NOTE — Telephone Encounter (Signed)
Left patient a message for her to call the office to verify that she knows that we are needing to cancel her appointment on 08/02/2018 at 10:30am due to COVID-19 and will call to reschedule after we start normal scheduling again.

## 2018-08-02 ENCOUNTER — Encounter: Payer: BC Managed Care – PPO | Admitting: Obstetrics and Gynecology

## 2018-10-12 ENCOUNTER — Other Ambulatory Visit: Payer: Self-pay | Admitting: Family Medicine

## 2018-10-12 DIAGNOSIS — I10 Essential (primary) hypertension: Secondary | ICD-10-CM

## 2018-10-19 ENCOUNTER — Other Ambulatory Visit: Payer: Self-pay | Admitting: Family Medicine

## 2018-10-20 ENCOUNTER — Ambulatory Visit (INDEPENDENT_AMBULATORY_CARE_PROVIDER_SITE_OTHER): Payer: BC Managed Care – PPO | Admitting: Family Medicine

## 2018-10-20 ENCOUNTER — Encounter: Payer: Self-pay | Admitting: Family Medicine

## 2018-10-20 VITALS — BP 119/64 | HR 83 | Ht 65.0 in | Wt 244.0 lb

## 2018-10-20 DIAGNOSIS — L659 Nonscarring hair loss, unspecified: Secondary | ICD-10-CM | POA: Diagnosis not present

## 2018-10-20 DIAGNOSIS — Z6841 Body Mass Index (BMI) 40.0 and over, adult: Secondary | ICD-10-CM

## 2018-10-20 DIAGNOSIS — I10 Essential (primary) hypertension: Secondary | ICD-10-CM | POA: Diagnosis not present

## 2018-10-20 DIAGNOSIS — D509 Iron deficiency anemia, unspecified: Secondary | ICD-10-CM | POA: Diagnosis not present

## 2018-10-20 NOTE — Assessment & Plan Note (Signed)
Suspicious for androgenic alopecia.  He does not have any other signs of acne or excess hair growth on the face.  Specific skin lesions on the scalp.  Discussed potential minutes including topical minoxidil and possibly oral finasteride.  But I would like for her to see dermatology for further evaluation just to make sure that we have the right diagnosis.

## 2018-10-20 NOTE — Assessment & Plan Note (Signed)
To recheck iron and ferritin levels.  Last checked about a year ago and her hemoglobin was 9.9 at the time.

## 2018-10-20 NOTE — Progress Notes (Signed)
Established Patient Office Visit  Subjective:  Patient ID: Kimberly Gamble, female    DOB: 11-29-1970  Age: 48 y.o. MRN: [id number]  CC:  Chief Complaint  Patient presents with  . Hypertension    HPI ASHTAN LATON presents for  Hypertension- Pt denies chest pain, SOB, dizziness, or heart palpitations.  Taking meds as directed w/o problems.  Denies medication side effects.    Weight loss - she has actually lost about 9 lbs  Since February!  It is fantastic.  She says she is really just been eating more fruits and vegetables that she has been home because of COVID.  She does not feel like she is made any other major changes just not eating out as much.  Is also been experiencing some hair loss.  She says she notices it mostly on the top of her head but starting to feel a little bit on the sides.  She says she is always had some thinning hair but this just seems to be much more dramatic.  She says she does have some scaly itchy scalp issues at times.  She has been wearing wigs because of her thin hair.  Past Medical History:  Diagnosis Date  . HLD (hyperlipidemia)    mild  . HTN (hypertension)    benign    Past Surgical History:  Procedure Laterality Date  . caesarean section    . CESAREAN SECTION    . TUBAL LIGATION      Family History  Problem Relation Age of Onset  . Heart attack Other   . Stroke Other     Social History   Socioeconomic History  . Marital status: Married    Spouse name: Not on file  . Number of children: Not on file  . Years of education: Not on file  . Highest education level: Not on file  Occupational History  . Occupation: Recruitment consultant for school    Social Needs  . Financial resource strain: Not on file  . Food insecurity    Worry: Not on file    Inability: Not on file  . Transportation needs    Medical: Not on file    Non-medical: Not on file  Tobacco Use  . Smoking status: Current Every Day Smoker    Packs/day: 0.30    Years:  15.00    Pack years: 4.50    Types: Cigarettes  . Smokeless tobacco: Never Used  . Tobacco comment: Per pt, smokes 8 cigs a day  Substance and Sexual Activity  . Alcohol use: No    Alcohol/week: 0.0 standard drinks  . Drug use: No  . Sexual activity: Not on file  Lifestyle  . Physical activity    Days per week: Not on file    Minutes per session: Not on file  . Stress: Not on file  Relationships  . Social Herbalist on phone: Not on file    Gets together: Not on file    Attends religious service: Not on file    Active member of club or organization: Not on file    Attends meetings of clubs or organizations: Not on file    Relationship status: Not on file  . Intimate partner violence    Fear of current or ex partner: Not on file    Emotionally abused: Not on file    Physically abused: Not on file    Forced sexual activity: Not on file  Other Topics Concern  .  Not on file  Social History Narrative   Regularly exercises 3 days per week. 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 GED. In college. Has been walking for exercise.      Outpatient Medications Prior to Visit  Medication Sig Dispense Refill  . AMBULATORY NON FORMULARY MEDICATION Medication Name: CPAP set to autopap 4-10 cm water pressure.  Call/Fax download in one week.  Fax to 1 vial 0  . fluticasone (FLONASE) 50 MCG/ACT nasal spray Place 2 sprays into both nostrils daily. 16 g 6  . levocetirizine (XYZAL ALLERGY 24HR) 5 MG tablet Take 1 tablet (5 mg total) by mouth every evening. 90 tablet 3  . lisinopril-hydrochlorothiazide (ZESTORETIC) 20-12.5 MG tablet TAKE 1 TABLET BY MOUTH DAILY 90 tablet 1  . rOPINIRole (REQUIP) 0.25 MG tablet Take 1-2 tablets (0.25-0.5 mg total) by mouth at bedtime. 60 tablet 3  . terbinafine (LAMISIL) 250 MG tablet TAKE 1 TABLET BY MOUTH DAILY 90 tablet 0  . amoxicillin-clavulanate (AUGMENTIN) 875-125 MG tablet Take 1 tablet by mouth 2 (two) times daily. 20 tablet 0   No  facility-administered medications prior to visit.     No Known Allergies  ROS Review of Systems    Objective:    Physical Exam  Constitutional: She is oriented to person, place, and time. She appears well-developed and well-nourished.  HENT:  Head: Normocephalic and atraumatic.  Cardiovascular: Normal rate, regular rhythm and normal heart sounds.  Pulmonary/Chest: Effort normal and breath sounds normal.  Neurological: She is alert and oriented to person, place, and time.  Skin: Skin is warm and dry.  Scalp shows thinning hair particularly on the top of her head but is also a little bit thin on the sides behind her ears.  She does have a little bit of scale on the scalp but no erythema.  I did not see any broken hairs.  No circular lesions of hair loss.  Psychiatric: She has a normal mood and affect. Her behavior is normal.    BP 119/64   Pulse 83   Ht 5\' 5"  (1.651 m)   Wt 244 lb (110.7 kg)   SpO2 100%   BMI 40.60 kg/m  Wt Readings from Last 3 Encounters:  10/20/18 244 lb (110.7 kg)  07/07/18 253 lb (114.8 kg)  04/20/18 257 lb 8 oz (116.8 kg)     Health Maintenance Due  Topic Date Due  . HIV Screening  07/16/1985    There are no preventive care reminders to display for this patient.  Lab Results  Component Value Date   TSH 0.76 07/07/2018   Lab Results  Component Value Date   WBC 4.5 07/07/2018   HGB 11.6 (L) 07/07/2018   HCT 35.5 07/07/2018   MCV 80.7 07/07/2018   PLT 366 07/07/2018   Lab Results  Component Value Date   NA 136 07/07/2018   K 3.7 07/07/2018   CO2 26 07/07/2018   GLUCOSE 83 07/07/2018   BUN 13 07/07/2018   CREATININE 0.87 07/07/2018   BILITOT 0.3 07/07/2018   ALKPHOS 43 04/08/2016   AST 12 07/07/2018   ALT 7 07/07/2018   PROT 7.1 07/07/2018   ALBUMIN 3.9 04/08/2016   CALCIUM 9.6 07/07/2018   Lab Results  Component Value Date   CHOL 193 07/07/2018   Lab Results  Component Value Date   HDL 52 07/07/2018   Lab Results   Component Value Date   LDLCALC 116 (H) 07/07/2018   Lab Results  Component Value Date  TRIG 132 07/07/2018   Lab Results  Component Value Date   CHOLHDL 3.7 07/07/2018   Lab Results  Component Value Date   HGBA1C 5.2 04/08/2016      Assessment & Plan:   Problem List Items Addressed This Visit      Cardiovascular and Mediastinum   HYPERTENSION, BENIGN - Primary   Relevant Orders   Fe+TIBC+Fer     Other   Iron deficiency anemia    To recheck iron and ferritin levels.  Last checked about a year ago and her hemoglobin was 9.9 at the time.      Hair loss    Suspicious for androgenic alopecia.  He does not have any other signs of acne or excess hair growth on the face.  Specific skin lesions on the scalp.  Discussed potential minutes including topical minoxidil and possibly oral finasteride.  But I would like for her to see dermatology for further evaluation just to make sure that we have the right diagnosis.      Relevant Orders   Ambulatory referral to Dermatology   Fe+TIBC+Fer    Other Visit Diagnoses    BMI 40.0-44.9, adult ([place])         BMI 40-she has done a fantastic job with weight loss just encouraged her to keep up her new changes to her diet by increasing vegetables and fruits.  It looks absolutely fantastic.  No orders of the defined types were placed in this encounter.   Follow-up: Return in about 6 months (around 04/21/2019) for bp.    Beatrice Lecher, MD

## 2018-10-21 LAB — IRON,TIBC AND FERRITIN PANEL
%SAT: 3 % (calc) — ABNORMAL LOW (ref 16–45)
Ferritin: 2 ng/mL — ABNORMAL LOW (ref 16–232)
Iron: 13 ug/dL — ABNORMAL LOW (ref 40–190)
TIBC: 425 mcg/dL (calc) (ref 250–450)

## 2018-10-25 MED ORDER — FUSION PLUS PO CAPS: ORAL_CAPSULE | ORAL | 11 refills | Status: DC

## 2018-10-25 NOTE — Addendum Note (Signed)
Addended by: Beatrice Lecher D on: 10/25/2018 01:11 PM   Modules accepted: Orders

## 2018-10-26 ENCOUNTER — Telehealth: Payer: Self-pay

## 2018-10-26 NOTE — Telephone Encounter (Signed)
Attempted to call pt to reschedule previous appt that was cancelled due to covid. PT did not answer. VM left.

## 2019-01-09 ENCOUNTER — Other Ambulatory Visit: Payer: Self-pay | Admitting: Family Medicine

## 2019-01-09 ENCOUNTER — Other Ambulatory Visit: Payer: Self-pay

## 2019-01-09 ENCOUNTER — Encounter: Payer: Self-pay | Admitting: Certified Nurse Midwife

## 2019-01-09 ENCOUNTER — Ambulatory Visit (INDEPENDENT_AMBULATORY_CARE_PROVIDER_SITE_OTHER): Payer: BC Managed Care – PPO | Admitting: Certified Nurse Midwife

## 2019-01-09 VITALS — BP 109/69 | HR 91 | Ht 65.0 in | Wt 250.0 lb

## 2019-01-09 DIAGNOSIS — Z01419 Encounter for gynecological examination (general) (routine) without abnormal findings: Secondary | ICD-10-CM

## 2019-01-09 DIAGNOSIS — N852 Hypertrophy of uterus: Secondary | ICD-10-CM

## 2019-01-09 DIAGNOSIS — I10 Essential (primary) hypertension: Secondary | ICD-10-CM

## 2019-01-09 DIAGNOSIS — N924 Excessive bleeding in the premenopausal period: Secondary | ICD-10-CM

## 2019-01-09 MED ORDER — MEGESTROL ACETATE 40 MG PO TABS: 40.0000 mg | ORAL_TABLET | Freq: Two times a day (BID) | ORAL | 0 refills | Status: DC

## 2019-01-09 NOTE — Patient Instructions (Signed)

## 2019-01-11 NOTE — Progress Notes (Signed)
GYNECOLOGY ANNUAL PREVENTATIVE CARE ENCOUNTER NOTE  History:     Kimberly Gamble is a 48 y.o. G92P0 female here for a routine annual gynecologic exam.  She is a new patient here to establish care. Current complaints: abnormal vaginal bleeding.   Denies vaginal discharge, pelvic pain, problems with intercourse or other gynecologic concerns.    Gynecologic History Patient's last menstrual period was 01/08/2019. Contraception: tubal ligation Last Pap: 10/2016. Results were: normal with negative HPV Last mammogram: 04/2018. Results were: normal  Obstetric History OB History  Gravida Para Term Preterm AB Living  3         4  SAB TAB Ectopic Multiple Live Births               # Outcome Date GA Lbr Len/2nd Weight Sex Delivery Anes PTL Lv  3 Gravida           2 Gravida           1 [place]             Past Medical History:  Diagnosis Date  . HLD (hyperlipidemia)    mild  . HTN (hypertension)    benign    Past Surgical History:  Procedure Laterality Date  . caesarean section    . CESAREAN SECTION    . TUBAL LIGATION      Current Outpatient Medications on File Prior to Visit  Medication Sig Dispense Refill  . AMBULATORY NON FORMULARY MEDICATION Medication Name: CPAP set to autopap 4-10 cm water pressure.  Call/Fax download in one week.  Fax to 1 vial 0  . fluticasone (FLONASE) 50 MCG/ACT nasal spray Place 2 sprays into both nostrils daily. 16 g 6  . Iron-FA-B Cmp-C-Biot-Probiotic (FUSION PLUS) CAPS 1 cap PO QD 30 capsule 11  . levocetirizine (XYZAL ALLERGY 24HR) 5 MG tablet Take 1 tablet (5 mg total) by mouth every evening. 90 tablet 3  . rOPINIRole (REQUIP) 0.25 MG tablet Take 1-2 tablets (0.25-0.5 mg total) by mouth at bedtime. 60 tablet 3  . terbinafine (LAMISIL) 250 MG tablet TAKE 1 TABLET BY MOUTH DAILY 90 tablet 0   No current facility-administered medications on file prior to visit.     No Known Allergies  Social History:  reports that she has been smoking  cigarettes. She has a 4.50 pack-year smoking history. She has never used smokeless tobacco. She reports that she does not drink alcohol or use drugs.  Family History  Problem Relation Age of Onset  . Heart attack Other   . Stroke Other     The following portions of the patient's history were reviewed and updated as appropriate: allergies, current medications, past family history, past medical history, past social history, past surgical history and problem list.  Review of Systems Pertinent items noted in HPI and remainder of comprehensive ROS otherwise negative.  Physical Exam:  BP 109/69   Pulse 91   Ht 5\' 5"  (1.651 m)   Wt 250 lb (113.4 kg)   LMP 01/08/2019   BMI 41.60 kg/m  CONSTITUTIONAL: Well-developed, obese female in no acute distress.  HENT:  Normocephalic, atraumatic, External right and left ear normal. Oropharynx is clear and moist EYES: Conjunctivae and EOM are normal. Pupils are equal, round, and reactive to light.   NECK: Normal range of motion, supple, no masses.  Normal thyroid.  SKIN: Skin is warm and dry. No rash noted. Not diaphoretic. No erythema. No pallor. MUSCULOSKELETAL: Normal range of motion. No tenderness.  No cyanosis, clubbing, or edema.  2+ distal pulses. NEUROLOGIC: Alert and oriented to person, place, and time. Normal reflexes, muscle tone coordination. No cranial nerve deficit noted. PSYCHIATRIC: Normal mood and affect. Normal behavior. Normal judgment and thought content. CARDIOVASCULAR: Normal heart rate noted, regular rhythm RESPIRATORY: Clear to auscultation bilaterally. Effort and breath sounds normal, no problems with respiration noted. BREASTS: Symmetric in size. No masses, skin changes, nipple drainage, or lymphadenopathy. ABDOMEN: Soft, normal bowel sounds, distention noted and tenderness on palpation.  No rebound or guarding.  PELVIC: Normal appearing external genitalia; normal appearing vaginal mucosa and cervix.  Large amount of vaginal  bleeding with clots present. Uterus enlarged to umbilicus. Uterus tenderness on palpation, no adnexal tenderness.   Assessment and Plan:    1. Women's annual routine gynecological examination - Pap not done today, pap due in 2023 - MM Digital Screening; Future  2. Enlarged uterus - Most likely fibroids, will order [place] to assess  - patient reports that because of abnormal bleeding and possible fibroids is hysterectomy possible- scheduled f/u with MD to discuss  - US PELVIS TRANSVAGINAL NON-OB (TV ONLY); Future  3. Abnormal vaginal bleeding in premenopausal patient - Patient reports vaginal bleeding since first week of July- large amounts with clots  - Educated and discussed use of megace to control vaginal bleeding prior to [place] and follow up appointment for management  - megestrol (MEGACE) 40 MG tablet; Take 1 tablet (40 mg total) by mouth 2 (two) times daily. Can increase to two tablets twice a day in the event of heavy bleeding  Dispense: 60 tablet; Refill: 0  Will follow up results of pap smear and manage accordingly. Mammogram ordered  Pelvic US ordered  Routine preventative health maintenance measures emphasized. Please refer to After Visit Summary for other counseling recommendations.       Lajean Manes, [place] for [employer], [place]

## 2019-01-16 ENCOUNTER — Other Ambulatory Visit: Payer: Self-pay

## 2019-01-16 ENCOUNTER — Ambulatory Visit (INDEPENDENT_AMBULATORY_CARE_PROVIDER_SITE_OTHER): Payer: BC Managed Care – PPO

## 2019-01-16 DIAGNOSIS — N852 Hypertrophy of uterus: Secondary | ICD-10-CM | POA: Diagnosis not present

## 2019-01-19 ENCOUNTER — Other Ambulatory Visit: Payer: Self-pay | Admitting: Family Medicine

## 2019-01-22 ENCOUNTER — Ambulatory Visit (INDEPENDENT_AMBULATORY_CARE_PROVIDER_SITE_OTHER): Payer: BC Managed Care – PPO | Admitting: Obstetrics & Gynecology

## 2019-01-22 ENCOUNTER — Other Ambulatory Visit: Payer: Self-pay

## 2019-01-22 ENCOUNTER — Encounter: Payer: Self-pay | Admitting: Obstetrics & Gynecology

## 2019-01-22 VITALS — BP 111/71 | HR 78 | Ht 65.0 in | Wt 250.1 lb

## 2019-01-22 DIAGNOSIS — Z3202 Encounter for pregnancy test, result negative: Secondary | ICD-10-CM

## 2019-01-22 DIAGNOSIS — N939 Abnormal uterine and vaginal bleeding, unspecified: Secondary | ICD-10-CM | POA: Diagnosis not present

## 2019-01-22 LAB — POCT URINE PREGNANCY: Preg Test, Ur: NEGATIVE

## 2019-01-22 NOTE — Progress Notes (Addendum)
   Subjective:    Patient ID: Kimberly Gamble, female    DOB: Jun 02, 1970, 48 y.o.   MRN: [id number]  HPI 48 yo married P4 (47, 72, and 48 yo twins) here today because she has decided that she wants a hysterectomy. She has had a large fibroid uterus for years, has heavy periods, and dyspareunia. She is mildly anemic. Her periods are monthly and last about 6 days.   Review of Systems She has had a BTL. She works for the school system, a Geophysicist/field seismologist.    Objective:   Physical Exam Breathing, conversing, and ambulating normally Well nourished, well hydrated Black female, no apparent distress Abd- benign, obese, Pfanensteil c/s scar I attempted to do a EMBX but I was still unable to do so even with using the longest speculum we have       Assessment & Plan:  Menorrhagia, anemia, fibroids Plan for TAH/BS I sent Drema Balzarine a message to schedule this. Flu vaccine

## 2019-03-01 NOTE — Progress Notes (Signed)
[facility] [place], [place] - [id number] [place] [address] Phone: [phone] Fax: [place], [place] - [address] AT [place] [address] Phone: [phone] Fax: [phone]      Your procedure is scheduled on March 06, 2019.  Report to [facility] Main Entrance "A" at 7:55 A.M., and check in at the Admitting office.  Call this number if you have problems the morning of surgery:  [phone]  Call [phone] if you have any questions prior to your surgery date Monday-Friday 8am-4pm    Remember:  Do not eat or drink after midnight the night before your surgery    Take these medicines the morning of surgery with A SIP OF WATER: megestrol (MEGACE)  terbinafine (LAMISIL) fluticasone (FLONASE)-if needed  As of today, STOP taking any Aspirin (unless otherwise instructed by your surgeon), Aleve, Naproxen, Ibuprofen, Motrin, Advil, Goody's, BC's, all herbal medications, fish oil, and all vitamins.    The Morning of Surgery  Do not wear jewelry, make-up or nail polish.  Do not wear lotions, powders, or perfumes or deodorant  Do not shave 48 hours prior to surgery.   Do not bring valuables to the hospital.  [facility] is not responsible for any belongings or valuables.  If you are a smoker, DO NOT Smoke 24 hours prior to surgery IF you wear a CPAP at night please bring your mask, tubing, and machine the morning of surgery   Remember that you must have someone to transport you home after your surgery, and remain with you for 24 hours if you are discharged the same day.   Contacts, glasses, hearing aids, dentures or bridgework may not be worn into surgery.    Leave your suitcase in the car.  After surgery it may be brought to your room.  For patients admitted to the hospital, discharge time will be determined by your treatment  team.  Patients discharged the day of surgery will not be allowed to drive home.    Special instructions:   [facility]- Preparing For Surgery  Before surgery, you can play an important role. Because skin is not sterile, your skin needs to be as free of germs as possible. You can reduce the number of germs on your skin by washing with CHG (chlorahexidine gluconate) Soap before surgery.  CHG is an antiseptic cleaner which kills germs and bonds with the skin to continue killing germs even after washing.    Oral Hygiene is also important to reduce your risk of infection.  Remember - BRUSH YOUR TEETH THE MORNING OF SURGERY WITH YOUR REGULAR TOOTHPASTE  Please do not use if you have an allergy to CHG or antibacterial soaps. If your skin becomes reddened/irritated stop using the CHG.  Do not shave (including legs and underarms) for at least 48 hours prior to first CHG shower. It is OK to shave your face.  Please follow these instructions carefully.   1. Shower the NIGHT BEFORE SURGERY and the MORNING OF SURGERY with CHG Soap.   2. If you chose to wash your hair, wash your hair first as usual with your normal shampoo.  3. After you shampoo, rinse your hair and body thoroughly to remove the shampoo.  4. Use CHG as you would any other liquid soap. You can apply CHG directly to the skin and wash gently with a  scrungie or a clean washcloth.   5. Apply the CHG Soap to your body ONLY FROM THE NECK DOWN.  Do not use on open wounds or open sores. Avoid contact with your eyes, ears, mouth and genitals (private parts). Wash Face and genitals (private parts)  with your normal soap.   6. Wash thoroughly, paying special attention to the area where your surgery will be performed.  7. Thoroughly rinse your body with warm water from the neck down.  8. DO NOT shower/wash with your normal soap after using and rinsing off the CHG Soap.  9. Pat yourself dry with a CLEAN TOWEL.  10. Wear CLEAN PAJAMAS to bed  the night before surgery, wear comfortable clothes the morning of surgery  11. Place CLEAN SHEETS on your bed the night of your first shower and DO NOT SLEEP WITH PETS.    Day of Surgery:  Do not apply any deodorants/lotions. Please shower the morning of surgery with the CHG soap  Please wear clean clothes to the hospital/surgery center.   Remember to brush your teeth WITH YOUR REGULAR TOOTHPASTE.   Please read over the following fact sheets that you were given.

## 2019-03-02 ENCOUNTER — Other Ambulatory Visit (HOSPITAL_COMMUNITY)
Admission: RE | Admit: 2019-03-02 | Discharge: 2019-03-02 | Disposition: A | Discharge status: Home / Self Care | Payer: BC Managed Care – PPO | Source: Ambulatory Visit | Attending: Obstetrics & Gynecology | Admitting: Obstetrics & Gynecology

## 2019-03-02 ENCOUNTER — Encounter (HOSPITAL_COMMUNITY): Payer: Self-pay

## 2019-03-02 ENCOUNTER — Other Ambulatory Visit: Payer: Self-pay

## 2019-03-02 ENCOUNTER — Encounter (HOSPITAL_COMMUNITY)
Admission: RE | Admit: 2019-03-02 | Discharge: 2019-03-02 | Disposition: A | Discharge status: Home / Self Care | Payer: BC Managed Care – PPO | Source: Ambulatory Visit | Attending: Obstetrics & Gynecology | Admitting: Obstetrics & Gynecology

## 2019-03-02 ENCOUNTER — Other Ambulatory Visit: Payer: Self-pay | Admitting: Obstetrics & Gynecology

## 2019-03-02 DIAGNOSIS — I10 Essential (primary) hypertension: Secondary | ICD-10-CM | POA: Diagnosis not present

## 2019-03-02 DIAGNOSIS — Z6841 Body Mass Index (BMI) 40.0 and over, adult: Secondary | ICD-10-CM | POA: Diagnosis not present

## 2019-03-02 DIAGNOSIS — F17210 Nicotine dependence, cigarettes, uncomplicated: Secondary | ICD-10-CM | POA: Diagnosis not present

## 2019-03-02 DIAGNOSIS — Z01818 Encounter for other preprocedural examination: Secondary | ICD-10-CM | POA: Insufficient documentation

## 2019-03-02 DIAGNOSIS — D259 Leiomyoma of uterus, unspecified: Secondary | ICD-10-CM | POA: Diagnosis not present

## 2019-03-02 DIAGNOSIS — E785 Hyperlipidemia, unspecified: Secondary | ICD-10-CM | POA: Diagnosis not present

## 2019-03-02 DIAGNOSIS — G4733 Obstructive sleep apnea (adult) (pediatric): Secondary | ICD-10-CM | POA: Insufficient documentation

## 2019-03-02 DIAGNOSIS — Z79899 Other long term (current) drug therapy: Secondary | ICD-10-CM | POA: Diagnosis not present

## 2019-03-02 DIAGNOSIS — R001 Bradycardia, unspecified: Secondary | ICD-10-CM | POA: Diagnosis not present

## 2019-03-02 HISTORY — DX: Sleep apnea, unspecified: G47.30

## 2019-03-02 HISTORY — DX: Family history of other specified conditions: Z84.89

## 2019-03-02 LAB — TYPE AND SCREEN
ABO/RH(D): O NEG
Antibody Screen: NEGATIVE

## 2019-03-02 LAB — BASIC METABOLIC PANEL
Anion gap: 7 (ref 5–15)
BUN: 11 mg/dL (ref 6–20)
CO2: 27 mmol/L (ref 22–32)
Calcium: 9.1 mg/dL (ref 8.9–10.3)
Chloride: 108 mmol/L (ref 98–111)
Creatinine, Ser: 0.87 mg/dL (ref 0.44–1.00)
GFR calc Af Amer: >60 mL/min (ref >60)
GFR calc non Af Amer: >60 mL/min (ref >60)
Glucose, Bld: 106 mg/dL — ABNORMAL HIGH (ref 70–99)
Potassium: 2.7 mmol/L — CL (ref 3.5–5.1)
Sodium: 142 mmol/L (ref 135–145)

## 2019-03-02 LAB — CBC
HCT: 32.7 % — ABNORMAL LOW (ref 36.0–46.0)
Hemoglobin: 9.8 g/dL — ABNORMAL LOW (ref 12.0–15.0)
MCH: 23 pg — ABNORMAL LOW (ref 26.0–34.0)
MCHC: 30 g/dL (ref 30.0–36.0)
MCV: 76.8 fL — ABNORMAL LOW (ref 80.0–100.0)
Platelets: 357 10*3/uL (ref 150–400)
RBC: 4.26 MIL/uL (ref 3.87–5.11)
RDW: 17.4 % — ABNORMAL HIGH (ref 11.5–15.5)
WBC: 4.4 10*3/uL (ref 4.0–10.5)
nRBC: 0 % (ref 0.0–0.2)

## 2019-03-02 LAB — ABO/RH: ABO/RH(D): O NEG

## 2019-03-02 MED ORDER — POTASSIUM CHLORIDE CRYS ER 20 MEQ PO TBCR: EXTENDED_RELEASE_TABLET | ORAL | 0 refills | Status: DC

## 2019-03-02 NOTE — Progress Notes (Signed)
PCP:  Beatrice Lecher Cardiologist:  denies  EKG:  03/02/19 CXR:  N/A ECHO:  denies Stress Test:  denies Cardiac Cath:  denies  Covid test today, 03/02/19  Anesthesia Review:  Yes, mother developed high fever with anesthesia and was hospitalized 4-5 days.   Sleep Apnea:  Yes, wears CPAP   Patient denies shortness of breath, fever, cough, and chest pain at PAT appointment.  Patient verbalized understanding of instructions provided today at the PAT appointment.  Patient asked to review instructions at home and day of surgery.

## 2019-03-02 NOTE — Progress Notes (Addendum)
Message sent to Dr. Hulan Fray with critical lab value: K+ 2.7.  Called office, but office was closed.  Spoke to answering service.  Someone was suppose to call right back, but did not receive return call.  Ebony Hail, [place] is aware. Gave chart to Clarene Critchley to follow-up with office

## 2019-03-02 NOTE — Progress Notes (Addendum)
Anesthesia Chart Review:  Case: [id number] Date/Time: 03/06/19 0945   Procedure: HYSTERECTOMY ABDOMINAL WITH SALPINGECTOMY (Bilateral )   Anesthesia type: Choice   Pre-op diagnosis: Fibroids   Location: [facility] OR ROOM 07 / [place] [facility]   Surgeon: Emily Filbert, MD      DISCUSSION: Patient is a 48 year old female scheduled for the above procedure.  History includes smoking, OSA (CPAP), HTN, HLD. BMI is consistent with morbid obesity. Patient denied personal history of known anesthesia complications, although her mother had post-operative fever following TKR (see below). Patient reported general surgery with another GYN procedure, but > 20 years ago, but otherwise had spinal anesthesia for previous c-section.     Patient's mother is Alfonse Flavors DOB 07/24/1952, [phone]). She had a right TKA at [facility] approximately 10 years ago and developed post-operative fever. I called and spoke with patient Kimberly Gamble. She does not remember much about the details, but thought it could have been related to anesthesia but wasn't for sure. She did have general anesthesia, but remembers waking up in PACU. She reported that during the day she would not have fevers, but for several nights (started around POD# 1 or 2) she would have nighttime fevers which kept her in the hospital for a few days longer than anticipated. She says infection was ruled out. She does not recall hearing the term malignant hyperthermia. She developed a large leg bruise or hematoma from post-TKR anticoagulation therapy, but otherwise did well following discharge. She reported general surgery for a hysterectomy in the past without issues. Kimberly Gamble gave me verbal consent to review her anesthesia records to better clarify if in fact "fevers" were felt related to anesthesia. I reviewed her right TKA progress notes, anesthesia records, and discharge summary. She underwent right TKA on 07/15/10. She did receive GA and a femoral nerve block. Per  handwritten anesthesia records, it looks like "Sevo" (sevoflurane) was utilized. She apparently developed chest pain in PACU, and was given IV nitroglycerin and ASA. Anesthesiologist Roberts Gaudy, MD actually checked on her on 07/17/10 and note does not mention any concern about anesthesia complication/reaction and primarily noted that she did not experience a cardiac event and was having a "stable postop course". Progress notes and discharge summary do not document any high fevers. She was discharged on 07/19/10. Based on records reviewed, I do not see that there was any suspicion for malignant hyperthermia after Kimberly Gamble (patient Kimberly Gamble's mother) had surgery on 07/15/10.  HGB 9.8. Patient reported anemia due to fibroids/heavy menses. Reportedly last HGB in the 9 range in 10/2018 (9.9-11.6 from 05/28/17-07/07/18 labs in [facility]). T&S done with PAT labs.   PAT labs showed a critical low K of 2.7. This was called to covering GYN Silas Sacramento, MD who is calling in to RX KCL 60 mEq on 03/02/19 and then 20 mEq daily x 3. Patient was also notified by our nursing staff of new prescription for hypokalemia. (I tentatively placed an ISTAT8 order for the morning of surgery, but order could be cancelled if Dr. Hulan Fray has patient get potassium rechecked sooner and results are 3.0 or better.)  03/02/19 presurgical COVID-19 test is in process. If negative and otherwise no acute changes then I anticipate that she can proceed as planned. She will need a urine pregnancy test on the day of surgery, and if K has not been rechecked then plan for ISTAT on arrival for surgery.    VS: BP 121/77   Pulse 76  Temp 36.6 C   Resp 20   Ht 5\' 5"  (1.651 m)   Wt 114.5 kg   LMP 01/26/2019   SpO2 96%   BMI 42.01 kg/m  Patient is a very pleasant black female in NAD. She denied chest pain and SOB. No syncope or persistent edema. No prior cardiac studies. No known CAD or DM history. Says she walks for exercise--primarily in her  large backyard. Heart RRR, no murmur noted. Lungs clear. No ankle edema noted.   PROVIDERS: Hali Marry, MD his PCP   LABS: Preoperative labs noted. See DISCUSSION. (all labs ordered are listed, but only abnormal results are displayed)  Labs Reviewed  BASIC METABOLIC PANEL - Abnormal; Notable for the following components:      Result Value   Potassium 2.7 (*)    Glucose, Bld 106 (*)    All other components within normal limits  CBC - Abnormal; Notable for the following components:   Hemoglobin 9.8 (*)    HCT 32.7 (*)    MCV 76.8 (*)    MCH 23.0 (*)    RDW 17.4 (*)    All other components within normal limits  PREGNANCY, URINE  TYPE AND SCREEN  ABO/RH    EKG: 03/02/19: Sinus bradycardia at 58 bpm Low voltage QRS Cannot rule out Anterior infarct , age undetermined Abnormal ECG   CV: N/A   Past Medical History:  Diagnosis Date  . Family history of adverse reaction to anesthesia    mother developed high fever after right TKA at [facility] (07/2010 records did not suggest any concern for suspected anesthesia reaction)  . HLD (hyperlipidemia)    mild  . HTN (hypertension)    benign  . Sleep apnea     Past Surgical History:  Procedure Laterality Date  . caesarean section    . CESAREAN SECTION    . TUBAL LIGATION      MEDICATIONS: . AMBULATORY NON FORMULARY MEDICATION  . Ferrous Sulfate (IRON) 28 MG TABS  . fluticasone (FLONASE) 50 MCG/ACT nasal spray  . Ibuprofen-diphenhydrAMINE HCl (ADVIL PM) 200-25 MG CAPS  . Iron-FA-B Cmp-C-Biot-Probiotic (FUSION PLUS) CAPS  . levocetirizine (XYZAL ALLERGY 24HR) 5 MG tablet  . lisinopril-hydrochlorothiazide (ZESTORETIC) 20-12.5 MG tablet  . megestrol (MEGACE) 40 MG tablet  . Multiple Vitamin (MULTIVITAMIN WITH MINERALS) TABS tablet  . Multiple Vitamins-Minerals (HAIR/SKIN/NAILS) CAPS  . potassium chloride SA (KLOR-CON) 20 MEQ tablet  . rOPINIRole (REQUIP) 0.25 MG tablet  . terbinafine (LAMISIL) 250 MG  tablet   No current facility-administered medications for this encounter.     Myra Gianotti, PA-C Surgical Short Stay/Anesthesiology [facility] Phone [phone] [facility] Phone [phone] 03/02/2019 6:18 PM

## 2019-03-02 NOTE — Progress Notes (Signed)
Called Dr. Hulan Fray and she is on vacation and asked that I call the physician on call at [phone] and ask the one on call to take care of the Potassium issue. I called the number and spoke with Dr. Gala Romney. She states she will call in a Rx for potassium for the patient and asked that I call pt and let her know. I called and left message for pt to return my call.

## 2019-03-02 NOTE — Progress Notes (Signed)
Pt having hysterectomy next week.  Pre op labs returned K=2.7  Reviewed medications.  Will replace K+ with 60 meq today and then 20 meq daily for 3 days.  Dr. Hulan Fray aware of labs and will make sure K+ is nml on day of surgery.

## 2019-03-02 NOTE — Progress Notes (Signed)
Pt returned call and I told her about her potassium being low and that Dr. Gala Romney is calling a prescription for her to the [facility] in [place]. She voiced understanding.

## 2019-03-05 LAB — NOVEL CORONAVIRUS, NAA (HOSP ORDER, SEND-OUT TO REF LAB; TAT 18-24 HRS): SARS-CoV-2, NAA: NOT DETECTED

## 2019-03-05 NOTE — Anesthesia Preprocedure Evaluation (Addendum)
Anesthesia Evaluation  Patient identified by MRN, date of birth, ID band  Reviewed: Allergy & Precautions, NPO status , Patient's Chart, lab work & pertinent test results  Airway Mallampati: II  TM Distance: >3 FB Neck ROM: Full    Dental  (+) Teeth Intact, Dental Advisory Given   Pulmonary Current Smoker,    breath sounds clear to auscultation       Cardiovascular hypertension,  Rhythm:Regular Rate:Normal     Neuro/Psych    GI/Hepatic   Endo/Other    Renal/GU      Musculoskeletal   Abdominal (+) + obese,   Peds  Hematology   Anesthesia Other Findings   Reproductive/Obstetrics                            Anesthesia Physical Anesthesia Plan  ASA: III  Anesthesia Plan: MAC and Spinal   Post-op Pain Management:    Induction:   PONV Risk Score and Plan: Ondansetron  Airway Management Planned: Natural Airway and Simple Face Mask  Additional Equipment:   Intra-op Plan:   Post-operative Plan:   Informed Consent: I have reviewed the patients History and Physical, chart, labs and discussed the procedure including the risks, benefits and alternatives for the proposed anesthesia with the patient or authorized representative who has indicated his/her understanding and acceptance.     Dental advisory given  Plan Discussed with: Anesthesiologist and CRNA  Anesthesia Plan Comments: (See PAT note written 03/05/2019 by Myra Gianotti, PA-C regarding mother's post anesthesia history. Hypokalemia supplemented, for ISTAT day of surgery if potassium has not bee rechecked since 03/02/19. )       Anesthesia Quick Evaluation

## 2019-03-06 ENCOUNTER — Inpatient Hospital Stay (HOSPITAL_COMMUNITY): Payer: BC Managed Care – PPO | Admitting: Vascular Surgery

## 2019-03-06 ENCOUNTER — Inpatient Hospital Stay (HOSPITAL_COMMUNITY)
Admission: RE | Admit: 2019-03-06 | Discharge: 2019-03-07 | DRG: 742 | Disposition: A | Discharge status: Home / Self Care | Payer: BC Managed Care – PPO | Source: Ambulatory Visit | Attending: Obstetrics & Gynecology | Admitting: Obstetrics & Gynecology

## 2019-03-06 ENCOUNTER — Encounter (HOSPITAL_COMMUNITY)
Admission: RE | Disposition: A | Discharge status: Home / Self Care | Payer: Self-pay | Source: Ambulatory Visit | Attending: Obstetrics & Gynecology

## 2019-03-06 ENCOUNTER — Other Ambulatory Visit: Payer: Self-pay

## 2019-03-06 ENCOUNTER — Encounter (HOSPITAL_COMMUNITY): Payer: Self-pay

## 2019-03-06 DIAGNOSIS — N920 Excessive and frequent menstruation with regular cycle: Secondary | ICD-10-CM | POA: Diagnosis present

## 2019-03-06 DIAGNOSIS — E785 Hyperlipidemia, unspecified: Secondary | ICD-10-CM | POA: Diagnosis present

## 2019-03-06 DIAGNOSIS — D259 Leiomyoma of uterus, unspecified: Secondary | ICD-10-CM | POA: Diagnosis present

## 2019-03-06 DIAGNOSIS — F17210 Nicotine dependence, cigarettes, uncomplicated: Secondary | ICD-10-CM | POA: Diagnosis present

## 2019-03-06 DIAGNOSIS — G4730 Sleep apnea, unspecified: Secondary | ICD-10-CM | POA: Diagnosis present

## 2019-03-06 DIAGNOSIS — L918 Other hypertrophic disorders of the skin: Secondary | ICD-10-CM

## 2019-03-06 DIAGNOSIS — Z9889 Other specified postprocedural states: Secondary | ICD-10-CM

## 2019-03-06 DIAGNOSIS — Z6841 Body Mass Index (BMI) 40.0 and over, adult: Secondary | ICD-10-CM | POA: Diagnosis not present

## 2019-03-06 DIAGNOSIS — Z90710 Acquired absence of both cervix and uterus: Secondary | ICD-10-CM | POA: Diagnosis present

## 2019-03-06 DIAGNOSIS — N840 Polyp of corpus uteri: Secondary | ICD-10-CM

## 2019-03-06 DIAGNOSIS — I10 Essential (primary) hypertension: Secondary | ICD-10-CM | POA: Diagnosis present

## 2019-03-06 DIAGNOSIS — N941 Unspecified dyspareunia: Secondary | ICD-10-CM | POA: Diagnosis present

## 2019-03-06 DIAGNOSIS — D649 Anemia, unspecified: Secondary | ICD-10-CM | POA: Diagnosis present

## 2019-03-06 DIAGNOSIS — Z79899 Other long term (current) drug therapy: Secondary | ICD-10-CM

## 2019-03-06 HISTORY — PX: ABDOMINAL HYSTERECTOMY: SHX81

## 2019-03-06 HISTORY — PX: EXCISION OF SKIN TAG: SHX6270

## 2019-03-06 HISTORY — PX: CYSTOSCOPY: SHX5120

## 2019-03-06 HISTORY — PX: HYSTERECTOMY ABDOMINAL WITH SALPINGECTOMY: SHX6725

## 2019-03-06 LAB — POCT I-STAT, CHEM 8
BUN: 11 mg/dL (ref 6–20)
Calcium, Ion: 1.19 mmol/L (ref 1.15–1.40)
Chloride: 102 mmol/L (ref 98–111)
Creatinine, Ser: 0.6 mg/dL (ref 0.44–1.00)
Glucose, Bld: 95 mg/dL (ref 70–99)
HCT: 34 % — ABNORMAL LOW (ref 36.0–46.0)
Hemoglobin: 11.6 g/dL — ABNORMAL LOW (ref 12.0–15.0)
Potassium: 3.1 mmol/L — ABNORMAL LOW (ref 3.5–5.1)
Sodium: 139 mmol/L (ref 135–145)
TCO2: 24 mmol/L (ref 22–32)

## 2019-03-06 LAB — POCT PREGNANCY, URINE: Preg Test, Ur: NEGATIVE

## 2019-03-06 SURGERY — HYSTERECTOMY, TOTAL, ABDOMINAL, WITH SALPINGECTOMY
Anesthesia: General | Site: Thigh | Laterality: Right

## 2019-03-06 MED ORDER — ONDANSETRON HCL 4 MG/2ML IJ SOLN: 4.0000 mg | Freq: Four times a day (QID) | INTRAMUSCULAR | Status: DC | PRN

## 2019-03-06 MED ORDER — 0.9 % SODIUM CHLORIDE (POUR BTL) OPTIME
TOPICAL | Status: DC | PRN
  Administered 2019-03-06: 1000 mL

## 2019-03-06 MED ORDER — PROPOFOL 10 MG/ML IV BOLUS
INTRAVENOUS | Status: DC | PRN
  Administered 2019-03-06: 150 mg via INTRAVENOUS

## 2019-03-06 MED ORDER — SILVER NITRATE-POT NITRATE 75-25 % EX MISC
CUTANEOUS | Status: DC | PRN
  Administered 2019-03-06: 2 via TOPICAL

## 2019-03-06 MED ORDER — DOCUSATE SODIUM 100 MG PO CAPS
100.0000 mg | ORAL_CAPSULE | Freq: Two times a day (BID) | ORAL | Status: DC
  Administered 2019-03-06 – 2019-03-07 (×2): 100 mg via ORAL
  Filled 2019-03-06 (×3): qty 1

## 2019-03-06 MED ORDER — FENTANYL CITRATE (PF) 100 MCG/2ML IJ SOLN
INTRAMUSCULAR | Status: DC | PRN
  Administered 2019-03-06 (×2): 50 ug via INTRAVENOUS

## 2019-03-06 MED ORDER — FENTANYL CITRATE (PF) 100 MCG/2ML IJ SOLN
INTRAMUSCULAR | Status: AC
  Administered 2019-03-06: 50 ug via INTRAVENOUS
  Filled 2019-03-06: qty 2

## 2019-03-06 MED ORDER — PROPOFOL 500 MG/50ML IV EMUL
INTRAVENOUS | Status: DC | PRN
  Administered 2019-03-06: 50 ug/kg/min via INTRAVENOUS

## 2019-03-06 MED ORDER — IBUPROFEN 800 MG PO TABS
800.0000 mg | ORAL_TABLET | Freq: Three times a day (TID) | ORAL | Status: DC
  Administered 2019-03-06 – 2019-03-07 (×4): 800 mg via ORAL
  Filled 2019-03-06 (×4): qty 1

## 2019-03-06 MED ORDER — ROPINIROLE HCL 0.5 MG PO TABS
0.2500 mg | ORAL_TABLET | Freq: Every day | ORAL | Status: DC
  Filled 2019-03-06: qty 1

## 2019-03-06 MED ORDER — METHYLENE BLUE 0.5 % INJ SOLN
INTRAVENOUS | Status: DC | PRN
  Administered 2019-03-06 (×2): 5 mg via INTRAVENOUS

## 2019-03-06 MED ORDER — BUPIVACAINE HCL (PF) 0.5 % IJ SOLN
INTRAMUSCULAR | Status: DC | PRN
  Administered 2019-03-06: 30 mL

## 2019-03-06 MED ORDER — LACTATED RINGERS IV SOLN
INTRAVENOUS | Status: DC
  Administered 2019-03-06: 16:00:00 via INTRAVENOUS

## 2019-03-06 MED ORDER — FLUTICASONE PROPIONATE 50 MCG/ACT NA SUSP
2.0000 | Freq: Every day | NASAL | Status: DC
  Filled 2019-03-06: qty 16

## 2019-03-06 MED ORDER — EPHEDRINE SULFATE 50 MG/ML IJ SOLN
INTRAMUSCULAR | Status: DC | PRN
  Administered 2019-03-06: 10 mg via INTRAVENOUS

## 2019-03-06 MED ORDER — OXYCODONE HCL 5 MG PO TABS
5.0000 mg | ORAL_TABLET | ORAL | Status: DC | PRN
  Administered 2019-03-06: 10 mg via ORAL
  Filled 2019-03-06: qty 2

## 2019-03-06 MED ORDER — MIDAZOLAM HCL 5 MG/5ML IJ SOLN
INTRAMUSCULAR | Status: DC | PRN
  Administered 2019-03-06: 1 mg via INTRAVENOUS
  Administered 2019-03-06: 2 mg via INTRAVENOUS
  Administered 2019-03-06: 1 mg via INTRAVENOUS

## 2019-03-06 MED ORDER — POTASSIUM CHLORIDE CRYS ER 10 MEQ PO TBCR
10.0000 meq | EXTENDED_RELEASE_TABLET | Freq: Every day | ORAL | Status: DC
  Administered 2019-03-06 – 2019-03-07 (×2): 10 meq via ORAL
  Filled 2019-03-06 (×2): qty 1

## 2019-03-06 MED ORDER — HYDROCHLOROTHIAZIDE 12.5 MG PO CAPS
12.5000 mg | ORAL_CAPSULE | Freq: Every day | ORAL | Status: DC
  Administered 2019-03-07: 12.5 mg via ORAL
  Filled 2019-03-06 (×2): qty 1

## 2019-03-06 MED ORDER — METHYLENE BLUE 0.5 % INJ SOLN
INTRAVENOUS | Status: AC
  Filled 2019-03-06: qty 10

## 2019-03-06 MED ORDER — SUGAMMADEX SODIUM 200 MG/2ML IV SOLN
INTRAVENOUS | Status: DC | PRN
  Administered 2019-03-06: 229 mg via INTRAVENOUS

## 2019-03-06 MED ORDER — CEFAZOLIN SODIUM-DEXTROSE 2-4 GM/100ML-% IV SOLN
INTRAVENOUS | Status: AC
  Filled 2019-03-06: qty 100

## 2019-03-06 MED ORDER — LISINOPRIL-HYDROCHLOROTHIAZIDE 20-12.5 MG PO TABS: 1.0000 | ORAL_TABLET | Freq: Every day | ORAL | Status: DC

## 2019-03-06 MED ORDER — PROPOFOL 10 MG/ML IV BOLUS
INTRAVENOUS | Status: AC
  Filled 2019-03-06: qty 40

## 2019-03-06 MED ORDER — MIDAZOLAM HCL 2 MG/2ML IJ SOLN
INTRAMUSCULAR | Status: AC
  Filled 2019-03-06: qty 2

## 2019-03-06 MED ORDER — FENTANYL CITRATE (PF) 250 MCG/5ML IJ SOLN
INTRAMUSCULAR | Status: AC
  Filled 2019-03-06: qty 5

## 2019-03-06 MED ORDER — ZOLPIDEM TARTRATE 5 MG PO TABS: 5.0000 mg | ORAL_TABLET | Freq: Every evening | ORAL | Status: DC | PRN

## 2019-03-06 MED ORDER — ACETAMINOPHEN 500 MG PO TABS
ORAL_TABLET | ORAL | Status: AC
  Administered 2019-03-06: 1000 mg via ORAL
  Filled 2019-03-06: qty 2

## 2019-03-06 MED ORDER — CEFAZOLIN SODIUM-DEXTROSE 2-4 GM/100ML-% IV SOLN
2.0000 g | INTRAVENOUS | Status: AC
  Administered 2019-03-06: 11:00:00 2 g via INTRAVENOUS

## 2019-03-06 MED ORDER — LACTATED RINGERS IV SOLN
INTRAVENOUS | Status: DC
  Administered 2019-03-06 (×6): via INTRAVENOUS

## 2019-03-06 MED ORDER — LACTATED RINGERS IV SOLN: INTRAVENOUS | Status: DC

## 2019-03-06 MED ORDER — ONDANSETRON HCL 4 MG/2ML IJ SOLN
INTRAMUSCULAR | Status: DC | PRN
  Administered 2019-03-06: 4 mg via INTRAVENOUS

## 2019-03-06 MED ORDER — LISINOPRIL 20 MG PO TABS
20.0000 mg | ORAL_TABLET | Freq: Every day | ORAL | Status: DC
  Administered 2019-03-07: 20 mg via ORAL
  Filled 2019-03-06: qty 1

## 2019-03-06 MED ORDER — ROCURONIUM BROMIDE 100 MG/10ML IV SOLN
INTRAVENOUS | Status: DC | PRN
  Administered 2019-03-06: 40 mg via INTRAVENOUS

## 2019-03-06 MED ORDER — FENTANYL CITRATE (PF) 100 MCG/2ML IJ SOLN
25.0000 ug | INTRAMUSCULAR | Status: DC | PRN
  Administered 2019-03-06 (×2): 50 ug via INTRAVENOUS

## 2019-03-06 MED ORDER — SUCCINYLCHOLINE CHLORIDE 20 MG/ML IJ SOLN
INTRAMUSCULAR | Status: DC | PRN
  Administered 2019-03-06: 120 mg via INTRAVENOUS

## 2019-03-06 MED ORDER — PHENYLEPHRINE HCL (PRESSORS) 10 MG/ML IV SOLN
INTRAVENOUS | Status: DC | PRN
  Administered 2019-03-06: 80 ug via INTRAVENOUS

## 2019-03-06 MED ORDER — SOD CITRATE-CITRIC ACID 500-334 MG/5ML PO SOLN
30.0000 mL | ORAL | Status: AC
  Administered 2019-03-06: 09:00:00 30 mL via ORAL

## 2019-03-06 MED ORDER — SOD CITRATE-CITRIC ACID 500-334 MG/5ML PO SOLN
ORAL | Status: AC
  Administered 2019-03-06: 30 mL via ORAL
  Filled 2019-03-06: qty 30

## 2019-03-06 MED ORDER — ACETAMINOPHEN 500 MG PO TABS
1000.0000 mg | ORAL_TABLET | ORAL | Status: AC
  Administered 2019-03-06: 09:00:00 1000 mg via ORAL

## 2019-03-06 MED ORDER — ACETAMINOPHEN 500 MG PO TABS
1000.0000 mg | ORAL_TABLET | Freq: Four times a day (QID) | ORAL | Status: DC
  Administered 2019-03-07 (×3): 1000 mg via ORAL
  Filled 2019-03-06 (×3): qty 2

## 2019-03-06 MED ORDER — ONDANSETRON HCL 4 MG PO TABS: 4.0000 mg | ORAL_TABLET | Freq: Four times a day (QID) | ORAL | Status: DC | PRN

## 2019-03-06 MED ORDER — METHYLENE BLUE 0.5 % INJ SOLN
5.0000 mL | Freq: Once | INTRAVENOUS | Status: DC
  Filled 2019-03-06: qty 10

## 2019-03-06 SURGICAL SUPPLY — 36 items
CANISTER SUCT 3000ML PPV (MISCELLANEOUS) ×5 IMPLANT
CLOSURE WOUND 1/2 X4 (GAUZE/BANDAGES/DRESSINGS) ×1
COVER WAND RF STERILE (DRAPES) ×5 IMPLANT
DECANTER SPIKE VIAL GLASS SM (MISCELLANEOUS) IMPLANT
DRAPE CESAREAN BIRTH W POUCH (DRAPES) ×5 IMPLANT
DRAPE WARM FLUID 44X44 (DRAPES) IMPLANT
DRSG OPSITE POSTOP 4X10 (GAUZE/BANDAGES/DRESSINGS) ×5 IMPLANT
DURAPREP 26ML APPLICATOR (WOUND CARE) ×5 IMPLANT
GAUZE 4X4 16PLY RFD (DISPOSABLE) ×5 IMPLANT
GLOVE BIO SURGEON STRL SZ 6.5 (GLOVE) ×4 IMPLANT
GLOVE BIO SURGEONS STRL SZ 6.5 (GLOVE) ×1
GLOVE BIOGEL PI IND STRL 7.0 (GLOVE) ×6 IMPLANT
GLOVE BIOGEL PI INDICATOR 7.0 (GLOVE) ×4
GOWN STRL REUS W/ TWL LRG LVL3 (GOWN DISPOSABLE) ×9 IMPLANT
GOWN STRL REUS W/TWL LRG LVL3 (GOWN DISPOSABLE) ×6
HEMOSTAT ARISTA ABSORB 3G PWDR (HEMOSTASIS) ×2 IMPLANT
HIBICLENS CHG 4% 4OZ BTL (MISCELLANEOUS) ×5 IMPLANT
KIT TURNOVER KIT B (KITS) ×5 IMPLANT
NDL SPNL 18GX3.5 QUINCKE PK (NEEDLE) ×3 IMPLANT
NEEDLE SPNL 18GX3.5 QUINCKE PK (NEEDLE) ×5 IMPLANT
NS IRRIG 1000ML POUR BTL (IV SOLUTION) ×5 IMPLANT
PACK ABDOMINAL GYN (CUSTOM PROCEDURE TRAY) ×5 IMPLANT
PAD ARMBOARD 7.5X6 YLW CONV (MISCELLANEOUS) ×5 IMPLANT
PAD OB MATERNITY 4.3X12.25 (PERSONAL CARE ITEMS) ×5 IMPLANT
SPECIMEN JAR MEDIUM (MISCELLANEOUS) ×5 IMPLANT
SPONGE LAP 18X18 RF (DISPOSABLE) ×14 IMPLANT
STRIP CLOSURE SKIN 1/2X4 (GAUZE/BANDAGES/DRESSINGS) ×4 IMPLANT
SUT CHROMIC 3 0 SH 27 (SUTURE) IMPLANT
SUT PDS AB 0 CTX 60 (SUTURE) IMPLANT
SUT VIC AB 0 CT1 36 (SUTURE) ×10 IMPLANT
SUT VIC AB 2-0 CT1 18 (SUTURE) ×15 IMPLANT
SUT VIC AB 3-0 CT1 27 (SUTURE) ×2
SUT VIC AB 3-0 CT1 TAPERPNT 27 (SUTURE) ×3 IMPLANT
SYR 30ML LL (SYRINGE) ×5 IMPLANT
TOWEL GREEN STERILE FF (TOWEL DISPOSABLE) ×10 IMPLANT
TRAY FOLEY W/BAG SLVR 14FR (SET/KITS/TRAYS/PACK) ×5 IMPLANT

## 2019-03-06 NOTE — H&P (Signed)
Kimberly Gamble is an 48 yo married P4 (50, 68, and 48 yo twins) here today because she has decided that she wants a hysterectomy. She has had a large fibroid uterus for years, has heavy periods, and dyspareunia. She is mildly anemic. Her hbg is 9.8. Her periods are monthly and last about 6 days.     Menstrual History: Menarche age: 36 Patient's last menstrual period was 02/07/2019.    Past Medical History:  Diagnosis Date  . Family history of adverse reaction to anesthesia    mother developed high fever after right TKA at [facility] (07/2010 records did not suggest any concern for suspected anesthesia reaction)  . HLD (hyperlipidemia)    mild  . HTN (hypertension)    benign  . Sleep apnea     Past Surgical History:  Procedure Laterality Date  . caesarean section    . CESAREAN SECTION    . TUBAL LIGATION      Family History  Problem Relation Age of Onset  . Heart attack Other   . Stroke Other     Social History:  reports that she has been smoking cigarettes. She has a 4.50 pack-year smoking history. She has never used smokeless tobacco. She reports that she does not drink alcohol or use drugs.  Allergies: No Known Allergies  Medications Prior to Admission  Medication Sig Dispense Refill Last Dose  . Ferrous Sulfate (IRON) 28 MG TABS Take 28 mg by mouth 2 (two) times daily.   03/01/2019  . Ibuprofen-diphenhydrAMINE HCl (ADVIL PM) 200-25 MG CAPS Take 2 tablets by mouth at bedtime as needed (sleep).   03/01/2019  . lisinopril-hydrochlorothiazide (ZESTORETIC) 20-12.5 MG tablet TAKE 1 TABLET BY MOUTH DAILY (Patient taking differently: Take 1 tablet by mouth daily. ) 90 tablet 1 03/05/2019 at Unknown time  . megestrol (MEGACE) 40 MG tablet Take 1 tablet (40 mg total) by mouth 2 (two) times daily. Can increase to two tablets twice a day in the event of heavy bleeding 60 tablet 0 03/06/2019 at 0530  . Multiple Vitamin (MULTIVITAMIN WITH MINERALS) TABS tablet Take 1  tablet by mouth daily.   03/01/2019  . Multiple Vitamins-Minerals (HAIR/SKIN/NAILS) CAPS Take 1 capsule by mouth daily.   03/01/2019  . potassium chloride SA (KLOR-CON) 20 MEQ tablet Take three tablets today and one tablet a day for three days 6 tablet 0 03/05/2019 at Unknown time  . terbinafine (LAMISIL) 250 MG tablet TAKE 1 TABLET BY MOUTH DAILY (Patient taking differently: Take 250 mg by mouth daily. ) 90 tablet 0 03/01/2019  . AMBULATORY NON FORMULARY MEDICATION Medication Name: CPAP set to autopap 4-10 cm water pressure.  Call/Fax download in one week.  Fax to 1 vial 0 Unknown at Unknown time  . fluticasone (FLONASE) 50 MCG/ACT nasal spray Place 2 sprays into both nostrils daily. (Patient taking differently: Place 2 sprays into both nostrils 2 (two) times daily as needed for allergies. ) 16 g 6 More than a month at Unknown time  . Iron-FA-B Cmp-C-Biot-Probiotic (FUSION PLUS) CAPS 1 cap PO QD 30 capsule 11 03/01/2019  . levocetirizine (XYZAL ALLERGY 24HR) 5 MG tablet Take 1 tablet (5 mg total) by mouth every evening. 90 tablet 3 More than a month at Unknown time  . rOPINIRole (REQUIP) 0.25 MG tablet Take 1-2 tablets (0.25-0.5 mg total) by mouth at bedtime. 60 tablet 3 More than a month at Unknown time    ROS  She works for the school [employer]. She lives with  husband and kids.  Blood pressure 119/64, pulse 73, temperature 97.7 F (36.5 C), resp. rate 20, height 5\' 5"  (1.651 m), weight 114.5 kg, last menstrual period 02/07/2019, SpO2 100 %. Physical Exam  Breathing, conversing, and ambulating normally Well nourished, well hydrated Black female, no apparent distress Heart-rrr Lungs- CTAB Abd- benign  No results found for this or any previous visit (from the past 24 hour(s)).  No results found.  Assessment/Plan: Symptomatic fibroid - plan for TAH/BS.  She understands the risks of surgery, including, but not to infection, bleeding, DVTs, damage to bowel, bladder, ureters. She wishes to  proceed.     Emily Filbert 03/06/2019, 8:45 AM

## 2019-03-06 NOTE — Transfer of Care (Signed)
Immediate Anesthesia Transfer of Care Note  Patient: Kimberly Gamble  Procedure(s) Performed: HYSTERECTOMY ABDOMINAL WITH SALPINGECTOMY (Bilateral Abdomen) Excision Of Skin Tag (Right Thigh) Cystoscopy (N/A Bladder)  Patient Location: PACU  Anesthesia Type:General and Spinal  Level of Consciousness: awake, alert , oriented and sedated  Airway & Oxygen Therapy: Patient Spontanous Breathing and Patient connected to face mask oxygen  Post-op Assessment: Report given to RN, Post -op Vital signs reviewed and stable and Patient moving all extremities  Post vital signs: Reviewed and stable  Last Vitals:  Vitals Value Taken Time  BP 117/62 03/06/19 1312  Temp 36.1 C 03/06/19 1312  Pulse 58 03/06/19 1314  Resp 19 03/06/19 1314  SpO2 100 % 03/06/19 1314  Vitals shown include unvalidated device data.  Last Pain:  Vitals:   03/06/19 1312  PainSc: (P) Asleep      Patients Stated Pain Goal: 3 ([id number])  Complications: No apparent anesthesia complications

## 2019-03-06 NOTE — Anesthesia Procedure Notes (Signed)
Procedure Name: Intubation Date/Time: 03/06/2019 12:03 PM Performed by: Scheryl Darter, CRNA Pre-anesthesia Checklist: Patient identified, Emergency Drugs available, Suction available and Patient being monitored Patient Re-evaluated:Patient Re-evaluated prior to induction Oxygen Delivery Method: Circle System Utilized Preoxygenation: Pre-oxygenation with 100% oxygen Induction Type: IV induction and Rapid sequence Ventilation: Mask ventilation without difficulty Laryngoscope Size: Mac and 3 Grade View: Grade I Tube type: Oral Tube size: 7.5 mm Number of attempts: 1 Airway Equipment and Method: Stylet and Oral airway Placement Confirmation: ETT inserted through vocal cords under direct vision,  positive ETCO2 and breath sounds checked- equal and bilateral Secured at: 22 cm Tube secured with: Tape Dental Injury: Teeth and Oropharynx as per pre-operative assessment

## 2019-03-06 NOTE — Anesthesia Postprocedure Evaluation (Signed)
Anesthesia Post Note  Patient: Arturo Morton Sliwinski  Procedure(s) Performed: HYSTERECTOMY ABDOMINAL WITH SALPINGECTOMY (Bilateral Abdomen) Excision Of Skin Tag (Right Thigh) Cystoscopy (N/A Bladder)     Patient location during evaluation: PACU Anesthesia Type: Spinal Level of consciousness: awake and alert Pain management: pain level controlled Vital Signs Assessment: post-procedure vital signs reviewed and stable Respiratory status: spontaneous breathing, nonlabored ventilation, respiratory function stable and patient connected to nasal cannula oxygen Cardiovascular status: blood pressure returned to baseline and stable Postop Assessment: no apparent nausea or vomiting and spinal receding Anesthetic complications: no    Last Vitals:  Vitals:   03/06/19 1353 03/06/19 1403  BP: 111/65 114/65  Pulse: (!) 58 (!) 56  Resp: 18 16  Temp: (!) 36.2 C 36.7 C  SpO2: 99% 100%    Last Pain:  Vitals:   03/06/19 1403  TempSrc: Oral  PainSc:                  Waller Marcussen COKER

## 2019-03-06 NOTE — Op Note (Addendum)
03/06/2019  12:58 PM  PATIENT:  Kimberly Gamble  48 y.o. female  PRE-OPERATIVE DIAGNOSIS:  Symptomatic fibroids and skin tag  POST-OPERATIVE DIAGNOSIS:  same  PROCEDURE:  Procedure(s) with comments: HYSTERECTOMY ABDOMINAL WITH SALPINGECTOMY (Bilateral) - sTARTED WITH SPINAL ANESTHESIA SWITCHED TO GENERAL ANESTHESIA AT Excision Of Skin Tag (Right) Cystoscopy (N/A)  SURGEON:  Surgeon(s) and Role:    * Tyreesha Maharaj, Wilhemina Cash, MD - Primary    * Fair, Marin Shutter, MD - Fellow  ASSISTANTS: Sherrian Divers, MS3   ANESTHESIA:   local, spinal and general  EBL:  400 mL   BLOOD ADMINISTERED:none  DRAINS: Urinary Catheter (Foley)   LOCAL MEDICATIONS USED:  MARCAINE     SPECIMEN:  Source of Specimen:  uterus, tubes  DISPOSITION OF SPECIMEN:  PATHOLOGY  COUNTS:  YES  TOURNIQUET:  * No tourniquets in log *  DICTATION: .Dragon Dictation  PLAN OF CARE: Admit to inpatient   PATIENT DISPOSITION:  PACU - hemodynamically stable.   Delay start of Pharmacological VTE agent (>24hrs) due to surgical blood loss or risk of bleeding: not applicable     The risks, benefits, and alternatives of surgery were explained, understood, and accepted. Consents were signed. All questions were answered. She was taken to the operating room and spinal anesthesia was applied without complication. Her abdomen and vagina were prepped and draped in the usual sterile fashion. I removed the skin tag located on her inner right upper thigh and used silver nitrate to achieve hemostasis.  A Foley catheter was placed which drained clear urine. After adequate anesthesia was assured a transverse incision was made at the site of her cesarean scar after injecting 30 mL of 0.5% marcaine in the subcutaneous tissue. The incision was carried down through the subcutaneous tissue to the fascia. Bleeding encountered was cauterized with the Bovie. The fascia was scored the midline and the fascial incision was extended bilaterally. The  pyramidalis muscles were separated in a transverse fashion using electrosurgical technique. Approximately 2 cm of the rectus muscles were separated in a transverse fashion in the midline using electrosurgical technique. Hemostasis was maintained. The peritoneum was entered with hemostats and the peritoneal incision was extended bilaterally with the Bovie, taking care to avoid bowel and bladder. The patient was placed in Trendelenburg position and her bowel was packed out of the abdominal cavity. The pelvis was inspected. Her large uterus filled the entire pelvis. I used towel clamps to elevate the uterus out of the incision. Coker clamps were used to elevate the uterus. The round ligaments were identified clamped cut and ligated. A bladder flap was created anteriorly and the bladder was pushed out of the operative site with a moist lap sponge.The bladder was densely adherent to the uterus almost up to the fundus. I used traction and the Bovie and Metzenbaum scissors to carefully take down the adhesions. The uteroovarian ligaments were identified bilaterally. They were clamped, cut, and ligated. Excellent hemostasis was noted. 2-0 Vicryl sutures used throughout this case unless otherwise specified. The uterine vessels were skeletonized, clamped, cut, and doubly ligated after a bladder flap was created anteriorly. I amputated the uterus to allow better visualization The remainder of the cervix was separated from its pelvic attachments using the same clamp, cut, ligate technique. Curved Heaney clamps were used to clamp beneath the cervix. The cervix and uterus were removed and sent to pathology. The vaginal cuff was noted to be hemostatic after placing 2 figure of eight sutures. I removed both oviducts.  All  pedicles were noted to be hemostatic. I placed Arista over the vaginal cuff. The Foley had a tinge of blood so I gave the patient methylene blue dye.  The sponges were removed from the pelvis. The rectus muscles  were inspected and hemostasis was assured. The fascia was closed with a 0 PDS running nonlocking suture. The subcutaneous tissue was irrigated, clean, dry.  A subcuticular closure was done with 3-0 vicryl suture. The skin was closed with 3-0 vicryl suture. She was placed in stirrups and I did a cystoscopy. I still did not see blue dye, so she was given another dose of methylene blue. I then was able to see urine from both ureters. There were not abnormalities of the bladder noted. A Foley catheter was placed.  She tolerated the procedure well and was taken to the recovery room in stable condition.

## 2019-03-06 NOTE — Anesthesia Procedure Notes (Signed)
Spinal  Patient location during procedure: OR Start time: 03/06/2019 10:50 AM End time: 03/06/2019 10:55 AM Staffing Anesthesiologist: Roberts Gaudy, MD Performed: anesthesiologist  Preanesthetic Checklist Completed: patient identified, site marked, surgical consent, pre-op evaluation, timeout performed, IV checked, risks and benefits discussed and monitors and equipment checked Spinal Block Patient position: sitting Prep: ChloraPrep Patient monitoring: heart rate, cardiac monitor, continuous pulse ox and blood pressure Approach: midline Location: L2-3 Injection technique: single-shot Needle Needle type: Whitacre  Needle gauge: 24 G Assessment Sensory level: T6 Additional Notes 1.8 cc 0.75% Bupivacaine injected easily

## 2019-03-07 ENCOUNTER — Encounter (HOSPITAL_COMMUNITY): Payer: Self-pay | Admitting: Obstetrics & Gynecology

## 2019-03-07 LAB — CBC
HCT: 24.3 % — ABNORMAL LOW (ref 36.0–46.0)
Hemoglobin: 7.7 g/dL — ABNORMAL LOW (ref 12.0–15.0)
MCH: 23.4 pg — ABNORMAL LOW (ref 26.0–34.0)
MCHC: 31.7 g/dL (ref 30.0–36.0)
MCV: 73.9 fL — ABNORMAL LOW (ref 80.0–100.0)
Platelets: 262 10*3/uL (ref 150–400)
RBC: 3.29 MIL/uL — ABNORMAL LOW (ref 3.87–5.11)
RDW: 17 % — ABNORMAL HIGH (ref 11.5–15.5)
WBC: 7.9 10*3/uL (ref 4.0–10.5)
nRBC: 0 % (ref 0.0–0.2)

## 2019-03-07 MED ORDER — DOCUSATE SODIUM 100 MG PO CAPS: 100.0000 mg | ORAL_CAPSULE | Freq: Two times a day (BID) | ORAL | 0 refills | Status: DC

## 2019-03-07 MED ORDER — OXYCODONE-ACETAMINOPHEN 5-325 MG PO TABS: 1.0000 | ORAL_TABLET | Freq: Four times a day (QID) | ORAL | 0 refills | Status: DC | PRN

## 2019-03-07 MED ORDER — IBUPROFEN 800 MG PO TABS: 800.0000 mg | ORAL_TABLET | Freq: Three times a day (TID) | ORAL | 0 refills | Status: DC

## 2019-03-07 MED ORDER — SODIUM CHLORIDE 0.9 % IV SOLN
510.0000 mg | Freq: Once | INTRAVENOUS | Status: AC
  Administered 2019-03-07: 510 mg via INTRAVENOUS
  Filled 2019-03-07: qty 17

## 2019-03-07 NOTE — Discharge Summary (Signed)
Physician Discharge Summary  Patient ID: Kimberly Gamble MRN: [id number] DOB/AGE: 48-Jun-1972 48 y.o.  Admit date: 03/06/2019 Discharge date: 03/07/2019  Admission Diagnoses: symptomatic fibroids, anemia  Discharge Diagnoses: same Active Problems:   S/P abdominal hysterectomy   Post-operative state   Discharged Condition: good  Hospital Course: She underwent an uncomplicated TAH/BS/cystoscopy. By POD #1 she was having flatus, tolerating po well, voiding, and ambulating without difficulty. She rates her pain as a "3", and voiced her readiness to go home this evening versus tomorrow.   Consults: None  Significant Diagnostic Studies: pre op hbg 11, post op 7.7  Treatments: surgery: as above and IV iron infusion on POD #1  Discharge Exam: Blood pressure 94/82, pulse 77, temperature 98.2 F (36.8 C), temperature source Oral, resp. rate 19, height 5\' 5"  (1.651 m), weight 114.5 kg, last menstrual period 02/07/2019, SpO2 100 %. General appearance: alert Resp: clear to auscultation bilaterally Cardio: regular rate and rhythm, S1, S2 normal, no murmur, click, rub or gallop GI: soft, non-tender; bowel sounds normal; no masses,  no organomegaly Incision/Wound: I removed her honeycomb dressing. The steristrips had some old blood on some of them. There is no active bleeding or erythema.  Disposition: Discharge disposition: 01-Home or Self Care         Follow-up Information    Emily Filbert, MD. Schedule an appointment as soon as possible for a visit in 5 week(s).   Specialty: Obstetrics and Gynecology Contact information: [address] [place] [address] [phone]           Signed: Emily Filbert 03/07/2019, 11:56 AM

## 2019-03-07 NOTE — Plan of Care (Signed)
Pt for discharge going home, alert and oriented, ambulatory, tolerates her meal,family at the bedside, discontinued peripheral IV line, given health teachings, next appointment, due med explained and understood, wound site with steri-strips dry and intact, no complain of pain at this time.

## 2019-03-07 NOTE — Plan of Care (Signed)
  Problem: Clinical Measurements: Goal: Ability to maintain clinical measurements within normal limits will improve Outcome: Progressing   Problem: Education: Goal: Knowledge of General Education information will improve Description: Including pain rating scale, medication(s)/side effects and non-pharmacologic comfort measures Outcome: Progressing   Problem: Activity: Goal: Risk for activity intolerance will decrease Outcome: Progressing   Problem: Pain Managment: Goal: General experience of comfort will improve Outcome: Progressing   

## 2019-03-07 NOTE — Discharge Instructions (Signed)
Abdominal Hysterectomy, Care After This sheet gives you information about how to care for yourself after your procedure. Your health care provider may also give you more specific instructions. If you have problems or questions, contact your health care provider. What can I expect after the procedure? After the procedure, it is common to have:  Pain.  Tiredness (fatigue).  Poor appetite.  Lowered interest in sex.  Bleeding and discharge from your vagina. You may need to use a pad in your underwear after this procedure. Follow these instructions at home: Medicines  Take over-the-counter and prescription medicines only as told by your doctor.  Do not take aspirin or ibuprofen. These medicines can cause bleeding.  Ask your doctor if the medicine prescribed to you: ? Requires you to avoid driving or using heavy machinery. ? Can cause trouble pooping (constipation). You may need to take these actions to prevent or treat trouble pooping:  Take over-the-counter or prescription medicines.  Eat foods that are high in fiber. These include beans, whole grains, and fresh fruits and vegetables.  Limit foods that are high in fat and processed sugars. These include fried or sweet foods. Surgical cut (incision) care      Follow instructions from your doctor about how to take care of your cut from surgery (incision). Make sure you: ? Wash your hands with soap and water before and after you change your bandage (dressing). If you cannot use soap and water, use hand sanitizer. ? Change your bandage as told by your doctor. ? Leave stitches (sutures), skin glue, or skin tape (adhesive) strips in place. They may need to stay in place for 2 weeks or longer. If tape strips get loose and curl up, you may trim the loose edges. Do not remove tape strips completely unless your doctor says it is okay.  Check your cut from surgery every day for signs of infection. Check for: ? Redness, swelling, or  pain. ? Fluid or blood. ? Warmth. ? Pus or a bad smell. Activity  Rest as told by your doctor. ? Do not sit for a long time without moving. Get up to take short walks every 1-2 hours. This is important. Ask for help if you feel weak or unsteady.  Do not lift anything that is heavier than 10 lb (4.5 kg), or the limit that you are told, until your doctor says that it is safe.  Do not drive or use heavy machinery while taking prescription pain medicine.  Follow your doctor's advice about exercise, driving, and general activities. Return to your normal activities as told by your doctor. Ask your doctor what activities are safe for you. Lifestyle  Do not douche, use tampons, or have sex for at least 6 weeks or as told by your doctor.  Do not drink alcohol until your doctor says it is okay.  Do not use any products that contain nicotine or tobacco, such as cigarettes, e-cigarettes, and chewing tobacco. If you need help quitting, ask your doctor. General instructions   Drink enough fluid to keep your pee (urine) pale yellow.  Do not take baths, swim, or use a hot tub until your doctor approves. Ask your doctor if you may take showers. You may only be allowed to take sponge baths.  Try to have someone at home with you for the first 1-2 weeks to help you with your daily chores at home.  Keep the bandage dry until your doctor says it can be taken off.  Wear tight-fitting (  compression) stockings as told by your health care provider. These stockings help to prevent blood clots and reduce swelling in your legs. °· Keep all follow-up visits as told by your doctor. This is important. °Contact a doctor if: °· You have any of these signs of infection: °? Redness, swelling, or pain around your cut. °? Fluid or blood coming from your cut. °? Warmth coming from your cut. °? Pus or a bad smell coming from your cut. °? Chills or a fever. °· Your cut breaks open. °· You feel dizzy or light-headed. °· You  have pain or bleeding when you pee. °· You keep having watery poop (diarrhea). °· You keep feeling like you may vomit (nauseous) or keep vomiting. °· You have unusual fluid (discharge) coming from your vagina. °· You have a rash. °· You have any type of reaction to your medicine that is not normal, or you develop an allergy to your medicine. °· Your pain medicine does not help. °Get help right away if: °· You have a fever and your symptoms get worse all of a sudden. °· You have very bad pain in your belly (abdomen). °· You are short of breath. °· You faint. °· You have pain, swelling, or redness of your leg. °· You bleed a lot from your vagina and notice clumps of blood (clots). °Summary °· Do not take baths, swim, or use a hot tub until your doctor approves. Ask your doctor if you may take showers. You may only be allowed to take sponge baths. °· Do not lift anything that is heavier than 10 lb (4.5 kg), or the limit that you are told, until your doctor says that it is safe. °· Follow your doctor's advice about exercise, driving, and general activities. Ask your doctor what activities are safe for you. °· Try to have someone at home with you for the first 1-2 weeks to help you with your daily chores at home. °This information is not intended to replace advice given to you by your health care provider. Make sure you discuss any questions you have with your health care provider. °Document Released: 02/03/2008 Document Revised: 06/29/2018 Document Reviewed: 04/14/2016 °Elsevier Patient Education © 2020 [employer] ° °

## 2019-03-07 NOTE — Progress Notes (Signed)
Pt discharge summary is incomplete need to reconcile, Dr. Hulan Fray aware.

## 2019-03-08 ENCOUNTER — Telehealth: Payer: Self-pay | Admitting: Obstetrics & Gynecology

## 2019-03-08 LAB — SURGICAL PATHOLOGY

## 2019-03-08 NOTE — Telephone Encounter (Signed)
I called to check on her (post op state). I left a voice mail.

## 2019-04-16 ENCOUNTER — Encounter: Payer: Self-pay | Admitting: Obstetrics & Gynecology

## 2019-04-16 ENCOUNTER — Ambulatory Visit (INDEPENDENT_AMBULATORY_CARE_PROVIDER_SITE_OTHER): Payer: BC Managed Care – PPO | Admitting: Obstetrics & Gynecology

## 2019-04-16 ENCOUNTER — Other Ambulatory Visit: Payer: Self-pay

## 2019-04-16 VITALS — BP 135/76 | HR 80 | Ht 65.0 in | Wt 250.0 lb

## 2019-04-16 DIAGNOSIS — Z9889 Other specified postprocedural states: Secondary | ICD-10-CM

## 2019-04-16 NOTE — Progress Notes (Signed)
   Subjective:    Patient ID: Kimberly Gamble, female    DOB: 08-Feb-1971, 48 y.o.   MRN: [id number]  HPI 48 yo married P4 here for a post op visit. She had a TAH/BS on 03/06/19. She has done well post operatively. She has not had sex yet. She reports normal bowel and bladder function. Prior to surgery she had some dyspareunia.   Review of Systems Pathology showed fibroids and adenomyosis.    Objective:   Physical Exam Breathing, conversing, and ambulating normally Well nourished, well hydrated Black female, no apparent distress Abd- benign Incision- healed beautifully Vaginal cuff- well-healed Bimanual exam- no masses or tendernes     Assessment & Plan:  Post op- doing well

## 2019-05-15 ENCOUNTER — Encounter: Payer: Self-pay | Admitting: Family Medicine

## 2019-05-15 ENCOUNTER — Ambulatory Visit (INDEPENDENT_AMBULATORY_CARE_PROVIDER_SITE_OTHER): Payer: BC Managed Care – PPO | Admitting: Family Medicine

## 2019-05-15 VITALS — BP 111/64 | Ht 65.0 in | Wt 247.4 lb

## 2019-05-15 DIAGNOSIS — D509 Iron deficiency anemia, unspecified: Secondary | ICD-10-CM

## 2019-05-15 DIAGNOSIS — R519 Headache, unspecified: Secondary | ICD-10-CM

## 2019-05-15 DIAGNOSIS — I10 Essential (primary) hypertension: Secondary | ICD-10-CM | POA: Diagnosis not present

## 2019-05-15 DIAGNOSIS — U071 COVID-19: Secondary | ICD-10-CM

## 2019-05-15 DIAGNOSIS — R52 Pain, unspecified: Secondary | ICD-10-CM

## 2019-05-15 NOTE — Assessment & Plan Note (Signed)
Recommend recheck hemoglobin next month.  Probably had some significant loss during her hysterectomy but hopefully will rebound.  Does not look like she is actively taking any iron right now.

## 2019-05-15 NOTE — Assessment & Plan Note (Addendum)
Well controlled. Continue current regimen. Follow up in  6 months.  Continue to monitor with home blood pressure cuff.  Due for CMP and lipids in February.  Encouraged her to just put it on her calendar and go sometime in February.  Went ahead and ordered the labs today.

## 2019-05-15 NOTE — Progress Notes (Signed)
Virtual Visit via Video Note  I connected with Kimberly Gamble on 05/15/19 at 10:50 AM EST by a video enabled telemedicine application and verified that I am speaking with the correct person using two identifiers.   I discussed the limitations of evaluation and management by telemedicine and the availability of in person appointments. The patient expressed understanding and agreed to proceed.  Subjective:    CC: F/U BP   HPI: Hypertension- Pt denies chest pain, SOB, dizziness, or heart palpitations.  Taking meds as directed w/o problems.  Denies medication side effects.  She has had a little chest discomfort but thinks it is related to the increased gas since recently being diagnosed with Covid.  Pt stated that she tested positive for COVID on 04/27/2019.  That she actually started getting symptoms around the 14th.  She reports that she continues to have mild sxs of body aches and headaches. Denies any other sxs.  That she did have a mild cough but that was short-lived and then seem to have resolved.  She never developed any fevers or diarrhea.  She still has some body aches and headache but is due to return to work on Monday she is hoping she will feel better by then.  She says she has had a lot of heartburn and gas since being diagnosed with Covid she says she is tried some over-the-counter Tums but they really have not been helping she has not tried anything else.  She is just getting pains that are moving around sometimes in her back sometimes it is in her arms he says it feels like sharp pains.  She did not get a flu shot this year and declines to have 1.  She also recently had a CBC with her hysterectomy where her hemoglobin was 9.8 which is a significant drop from about 10 months ago.  She has not had a repeat CBC since then and has a history of iron deficiency anemia.  Past medical history, Surgical history, Family history not pertinant except as noted below, Social history, Allergies,  and medications have been entered into the medical record, reviewed, and corrections made.   Review of Systems: No fevers, chills, night sweats, weight loss, chest pain, or shortness of breath.   Objective:    General: Speaking clearly in complete sentences without any shortness of breath.  Alert and oriented x3.  Normal judgment. No apparent acute distress.   Impression and Recommendations:   HYPERTENSION, BENIGN Well controlled. Continue current regimen. Follow up in  6 months.  Continue to monitor with home blood pressure cuff.  Due for CMP and lipids in February.  Encouraged her to just put it on her calendar and go sometime in February.  Went ahead and ordered the labs today.  Iron deficiency anemia Recommend recheck hemoglobin next month.  Probably had some significant loss during her hysterectomy but hopefully will rebound.  Does not look like she is actively taking any iron right now.   COVID-19 with body aches and headaches-flu she will continue to improve and be able to return to work on Monday if not please give [place] a call back.  She has quarantine for the 14 days so otherwise is okay to return unless her symptoms are persisting and she still not feeling well.  Continue with symptomatic care.      I discussed the assessment and treatment plan with the patient. The patient was provided an opportunity to ask questions and all were answered. The patient agreed  with the plan and demonstrated an understanding of the instructions.   The patient was advised to call back or seek an in-person evaluation if the symptoms worsen or if the condition fails to improve as anticipated.   Beatrice Lecher, MD

## 2019-05-15 NOTE — Progress Notes (Signed)
Pt stated that she tested positive for COVID on 04/29/2019. She reports that she continues to have mild sxs of body aches and headaches. Denies any other sxs.

## 2019-06-15 ENCOUNTER — Other Ambulatory Visit: Payer: Self-pay | Admitting: Family Medicine

## 2019-06-15 DIAGNOSIS — Z1231 Encounter for screening mammogram for malignant neoplasm of breast: Secondary | ICD-10-CM

## 2019-06-27 ENCOUNTER — Other Ambulatory Visit: Payer: Self-pay

## 2019-06-27 ENCOUNTER — Ambulatory Visit (INDEPENDENT_AMBULATORY_CARE_PROVIDER_SITE_OTHER): Payer: BC Managed Care – PPO

## 2019-06-27 DIAGNOSIS — Z1231 Encounter for screening mammogram for malignant neoplasm of breast: Secondary | ICD-10-CM | POA: Diagnosis not present

## 2019-06-28 LAB — COMPLETE METABOLIC PANEL WITH GFR
AG Ratio: 1.8 (calc) (ref 1.0–2.5)
ALT: 10 U/L (ref 6–29)
AST: 14 U/L (ref 10–35)
Albumin: 4.2 g/dL (ref 3.6–5.1)
Alkaline phosphatase (APISO): 62 U/L (ref 31–125)
BUN: 14 mg/dL (ref 7–25)
CO2: 29 mmol/L (ref 20–32)
Calcium: 9.5 mg/dL (ref 8.6–10.2)
Chloride: 101 mmol/L (ref 98–110)
Creat: 0.67 mg/dL (ref 0.50–1.10)
GFR, Est African American: 120 mL/min/{1.73_m2} (ref >=60)
GFR, Est Non African American: 104 mL/min/{1.73_m2} (ref >=60)
Globulin: 2.4 g/dL (calc) (ref 1.9–3.7)
Glucose, Bld: 92 mg/dL (ref 65–99)
Potassium: 3.5 mmol/L (ref 3.5–5.3)
Sodium: 137 mmol/L (ref 135–146)
Total Bilirubin: 0.3 mg/dL (ref 0.2–1.2)
Total Protein: 6.6 g/dL (ref 6.1–8.1)

## 2019-06-28 LAB — CBC
HCT: 39 % (ref 35.0–45.0)
Hemoglobin: 12.6 g/dL (ref 11.7–15.5)
MCH: 26.6 pg — ABNORMAL LOW (ref 27.0–33.0)
MCHC: 32.3 g/dL (ref 32.0–36.0)
MCV: 82.5 fL (ref 80.0–100.0)
MPV: 11.1 fL (ref 7.5–12.5)
Platelets: 232 10*3/uL (ref 140–400)
RBC: 4.73 10*6/uL (ref 3.80–5.10)
RDW: 15.6 % — ABNORMAL HIGH (ref 11.0–15.0)
WBC: 4.6 10*3/uL (ref 3.8–10.8)

## 2019-06-28 LAB — LIPID PANEL
Cholesterol: 197 mg/dL (ref <200)
HDL: 48 mg/dL — ABNORMAL LOW (ref >=50)
LDL Cholesterol (Calc): 126 mg/dL (calc) — ABNORMAL HIGH
Non-HDL Cholesterol (Calc): 149 mg/dL (calc) — ABNORMAL HIGH (ref <130)
Total CHOL/HDL Ratio: 4.1 (calc) (ref <5.0)
Triglycerides: 122 mg/dL (ref <150)

## 2019-06-28 LAB — FERRITIN: Ferritin: 6 ng/mL — ABNORMAL LOW (ref 16–232)

## 2019-09-26 ENCOUNTER — Telehealth: Payer: Self-pay

## 2019-09-26 DIAGNOSIS — M79673 Pain in unspecified foot: Secondary | ICD-10-CM

## 2019-09-26 NOTE — Telephone Encounter (Signed)
Pt called requesting a Podiatrist referral. Per pt, she continues to have feet pain in the arch area. She is having trouble walking around. Pt did not have a preference of location. Referral pended.

## 2019-09-26 NOTE — Telephone Encounter (Signed)
Pt has been updated regarding Podiatrist referral. No other inquiries during the call.

## 2019-09-26 NOTE — Telephone Encounter (Signed)
Referral placed.

## 2019-10-05 ENCOUNTER — Other Ambulatory Visit: Payer: Self-pay | Admitting: Family Medicine

## 2019-10-05 DIAGNOSIS — I10 Essential (primary) hypertension: Secondary | ICD-10-CM

## 2019-10-12 ENCOUNTER — Encounter: Payer: Self-pay | Admitting: Podiatry

## 2019-10-12 ENCOUNTER — Ambulatory Visit (INDEPENDENT_AMBULATORY_CARE_PROVIDER_SITE_OTHER): Payer: BC Managed Care – PPO

## 2019-10-12 ENCOUNTER — Encounter: Payer: Self-pay | Admitting: Hematology & Oncology

## 2019-10-12 ENCOUNTER — Other Ambulatory Visit: Payer: Self-pay

## 2019-10-12 ENCOUNTER — Ambulatory Visit: Payer: BC Managed Care – PPO | Admitting: Podiatry

## 2019-10-12 VITALS — Temp 97.7°F

## 2019-10-12 DIAGNOSIS — M76829 Posterior tibial tendinitis, unspecified leg: Secondary | ICD-10-CM | POA: Diagnosis not present

## 2019-10-12 DIAGNOSIS — M722 Plantar fascial fibromatosis: Secondary | ICD-10-CM

## 2019-10-12 DIAGNOSIS — M7751 Other enthesopathy of right foot: Secondary | ICD-10-CM

## 2019-10-12 MED ORDER — METHYLPREDNISOLONE 4 MG PO TBPK
ORAL_TABLET | ORAL | 0 refills | Status: DC
Start: 2019-10-12 — End: 2019-11-09

## 2019-10-12 NOTE — Patient Instructions (Signed)
For instructions on how to put on your Tri-Lock Ankle Brace, please visit [email or web address]   Posterior Tibial Tendinitis Posterior tibial tendinitis is irritation of a tendon called the posterior tibial tendon. Your posterior tibial tendon is a cord-like tissue that connects bones of your lower leg and foot to a muscle that:  Supports your arch.  Helps you raise up on your toes.  Helps you turn your foot down and in. This condition causes foot and ankle pain. It can also lead to a flat foot. What are the causes? This condition is most often caused by repeated stress to the tendon (overuse injury). It can also be caused by a sudden injury that stresses the tendon, such as landing on your foot after jumping or falling. What increases the risk? This condition is more likely to develop in:  People who play a sport that involves putting a lot of pressure on the feet, such as: ? Basketball. ? Tennis. ? Soccer. ? Hockey.  Runners.  Females who are older than 49 years of age and are overweight.  People with diabetes.  People with decreased foot stability.  People with flat feet. What are the signs or symptoms? Symptoms include:  Pain in the inner ankle.  Pain at the arch of your foot.  Pain that gets worse with running, walking, or standing.  Swelling on the inside of your ankle and foot.  Weakness in your ankle or foot.  Inability to stand up on tiptoe.  Flattening of the arch of your foot. How is this diagnosed? This condition may be diagnosed based on:  Your symptoms.  Your medical history.  A physical exam.  Tests, such as: ? X-ray. ? MRI. ? Ultrasound. How is this treated? This condition may be treated by:  Putting ice to the injured area.  Taking NSAIDs, such as ibuprofen, to reduce pain and swelling.  Wearing a special shoe or shoe insert to support your arch (orthotic).  Having physical therapy.  Replacing high-impact exercise with  low-impact exercise, such as swimming or cycling. If your symptoms do not improve with these treatments, you may need to wear a splint, removable walking boot, or short leg cast for 6-8 weeks to keep your foot and ankle still (immobilized). Follow these instructions at home: If you have a cast, splint, or boot:  Keep it clean and dry.  Check the skin around it every day. Tell your health care provider about any concerns. If you have a cast:  Do not stick anything inside it to scratch your skin. Doing that increases your risk of infection.  You may put lotion on dry skin around the edges of the cast. Do not put lotion on the skin underneath the cast. If you have a splint or boot:  Wear it as told by your health care provider. Remove it only as told by your health care provider.  Loosen it if your toes tingle, become numb, or turn cold and blue. Bathing  Do not take baths, swim, or use a hot tub until your health care provider approves. Ask your health care provider if you may take showers.  If your cast, splint, or boot is not waterproof: ? Do not let it get wet. ? Cover it with a waterproof covering while you take a bath or a shower. Managing pain and swelling   If directed, put ice on the injured area. ? If you have a removable splint or boot, remove it as told by your health care  provider. ? Put ice in a plastic bag. ? Place a towel between your skin and the bag or between your cast and the bag. ? Leave the ice on for 20 minutes, 2-3 times a day.  Move your toes often to reduce stiffness and swelling.  Raise (elevate) the injured area above the level of your heart while you are sitting or lying down. Activity  Do not use the injured foot to support your body weight until your health care provider says that you can. Use crutches as told by your health care provider.  Do not do activities that make pain or swelling worse.  Ask your health care provider when it is safe to  drive if you have a cast, splint, or boot on your foot.  Return to your normal activities as told by your health care provider. Ask your health care provider what activities are safe for you.  Do exercises as told by your health care provider. General instructions  Take over-the-counter and prescription medicines only as told by your health care provider.  If you have an orthotic, use it as told by your health care provider.  Keep all follow-up visits as told by your health care provider. This is important. How is this prevented?  Wear footwear that is appropriate to your athletic activity.  Avoid athletic activities that cause pain or swelling in your ankle or foot.  Before being active, do range-of-motion and stretching exercises.  If you develop pain or swelling while training, stop training.  If you have pain or swelling that does not improve after a few days of rest, see your health care provider.  If you start a new athletic activity, start gradually so you can build up your strength and flexibility. Contact a health care provider if:  Your symptoms get worse.  Your symptoms do not improve in 6-8 weeks.  You develop new, unexplained symptoms.  Your splint, boot, or cast gets damaged. Summary  Posterior tibial tendinitis is irritation of a tendon called the posterior tibial tendon.  This condition is most often caused by repeated stress to the tendon (overuse injury).  This condition causes foot pain and ankle pain. It can also lead to a flat foot.  This condition may be treated by not doing high-impact activities, applying ice, having physical therapy, wearing orthotics, and wearing a cast, splint, or boot if needed. This information is not intended to replace advice given to you by your health care provider. Make sure you discuss any questions you have with your health care provider. Document Revised: 08/22/2018 Document Reviewed: 06/29/2018 Elsevier Patient  Education  [place].

## 2019-10-18 NOTE — Progress Notes (Signed)
Subjective:   Patient ID: Kimberly Gamble, female   DOB: 49 y.o.   MRN: [id number]   HPI 49 year old female presents the office today for concerns of right foot, ankle pain along the medial aspect has been ongoing for last 1 month.  She has noticed some localized swelling.  She denies recent injury or trauma.  No recent treatment.  She is tried over-the-counter arch supports which helps some.  She has no other concerns today.   Review of Systems  All other systems reviewed and are negative.  Past Medical History:  Diagnosis Date  . Family history of adverse reaction to anesthesia    mother developed high fever after right TKA at [facility] (07/2010 records did not suggest any concern for suspected anesthesia reaction)  . HLD (hyperlipidemia)    mild  . HTN (hypertension)    benign  . Sleep apnea     Past Surgical History:  Procedure Laterality Date  . ABDOMINAL HYSTERECTOMY  03/06/2019  . caesarean section    . CESAREAN SECTION    . CYSTOSCOPY N/A 03/06/2019   Procedure: Cystoscopy;  Surgeon: Emily Filbert, MD;  Location: [place];  Service: Gynecology;  Laterality: N/A;  . EXCISION OF SKIN TAG Right 03/06/2019   Procedure: Excision Of Skin Tag;  Surgeon: Emily Filbert, MD;  Location: [place];  Service: Gynecology;  Laterality: Right;  . HYSTERECTOMY ABDOMINAL WITH SALPINGECTOMY Bilateral 03/06/2019   Procedure: HYSTERECTOMY ABDOMINAL WITH SALPINGECTOMY;  Surgeon: Emily Filbert, MD;  Location: [place];  Service: Gynecology;  Laterality: Bilateral;  sTARTED WITH SPINAL ANESTHESIA SWITCHED TO GENERAL ANESTHESIA AT  . TUBAL LIGATION       Current Outpatient Medications:  .  AMBULATORY NON FORMULARY MEDICATION, Medication Name: CPAP set to autopap 4-10 cm water pressure.  Call/Fax download in one week.  Fax to, Disp: 1 vial, Rfl: 0 .  docusate sodium (COLACE) 100 MG capsule, Take 1 capsule (100 mg total) by mouth 2 (two) times daily., Disp: 60 capsule, Rfl: 0 .  fluticasone  (FLONASE) 50 MCG/ACT nasal spray, Place 2 sprays into both nostrils daily., Disp: 16 g, Rfl: 6 .  levocetirizine (XYZAL ALLERGY 24HR) 5 MG tablet, Take 1 tablet (5 mg total) by mouth every evening., Disp: 90 tablet, Rfl: 3 .  lisinopril-hydrochlorothiazide (ZESTORETIC) 20-12.5 MG tablet, TAKE 1 TABLET BY MOUTH DAILY, Disp: 90 tablet, Rfl: 1 .  Multiple Vitamin (MULTIVITAMIN WITH MINERALS) TABS tablet, Take 1 tablet by mouth daily., Disp: , Rfl:  .  Multiple Vitamins-Minerals (HAIR/SKIN/NAILS) CAPS, Take 1 capsule by mouth daily., Disp: , Rfl:  .  rOPINIRole (REQUIP) 0.25 MG tablet, Take 1-2 tablets (0.25-0.5 mg total) by mouth at bedtime., Disp: 60 tablet, Rfl: 3 .  methylPREDNISolone (MEDROL DOSEPAK) 4 MG TBPK tablet, Take as directed, Disp: 21 tablet, Rfl: 0  No Known Allergies        Objective:  Physical Exam  General: AAO x3, NAD  Dermatological: Skin is warm, dry and supple bilateral. Nails x 10 are well manicured; remaining integument appears unremarkable at this time. There are no open sores, no preulcerative lesions, no rash or signs of infection present.  Vascular: Dorsalis Pedis artery and Posterior Tibial artery pedal pulses are 2/4 bilateral with immedate capillary fill time. Pedal hair growth present. No varicosities and no lower extremity edema present bilateral. There is no pain with calf compression, swelling, warmth, erythema.   Neruologic: Grossly intact via light touch bilateral.  Sensation intact with Thornell Mule  monofilament  Musculoskeletal: There is tenderness on medial aspect ankle on the course the posterior tibial tendon there is localized edema.  Minimal discomfort in the arch of the foot on the medial band plantar fascia.  Flexor, extensor tendons appear to be intact.  She is able to do a single and double heel rise although tenderness with single heel rise on the right side.  Achilles tendon appears to be intact.  Muscular strength 5/5 in all groups tested  bilateral.  Gait: Unassisted, Nonantalgic.       Assessment:   49 year old female with posterior tibial tendinitis, plantar fasciitis     Plan:  -Treatment options discussed including all alternatives, risks, and complications -Etiology of symptoms were discussed -X-rays were obtained and reviewed with the patient.  I independently reviewed the x-rays.  There is no evidence of acute fracture or stress fracture identified today. -Tri-Lock ankle brace dispensed. -Medrol Dosepak -Discussed shoe modifications and orthotics.  We will check orthotic coverage.  Return in about 4 weeks (around 11/09/2019).  If no improvement will order MRI.  Trula Slade DPM

## 2019-11-09 ENCOUNTER — Encounter: Payer: Self-pay | Admitting: Podiatry

## 2019-11-09 ENCOUNTER — Ambulatory Visit (INDEPENDENT_AMBULATORY_CARE_PROVIDER_SITE_OTHER): Payer: BC Managed Care – PPO | Admitting: Podiatry

## 2019-11-09 ENCOUNTER — Other Ambulatory Visit: Payer: Self-pay

## 2019-11-09 VITALS — Temp 98.0°F

## 2019-11-09 DIAGNOSIS — M7751 Other enthesopathy of right foot: Secondary | ICD-10-CM

## 2019-11-09 DIAGNOSIS — M722 Plantar fascial fibromatosis: Secondary | ICD-10-CM | POA: Diagnosis not present

## 2019-11-09 DIAGNOSIS — M76829 Posterior tibial tendinitis, unspecified leg: Secondary | ICD-10-CM | POA: Diagnosis not present

## 2019-11-09 NOTE — Progress Notes (Signed)
Subjective: 49 year old female presents the office today for follow-up evaluation of right posterior tibial tendinitis, plantar fasciitis.  She states that she is doing much better.  She will occasionally get some mild discomfort at nighttime every couple of nights but not on a regular basis.  No swelling this is resolved.  She finished the Medrol Dosepak as well as using the brace which is been helpful. Denies any systemic complaints such as fevers, chills, nausea, vomiting. No acute changes since last appointment, and no other complaints at this time.   Objective: AAO x3, NAD DP/PT pulses palpable bilaterally, CRT less than 3 seconds Decrease in medial arch height upon weightbearing.  This time there is no tenderness palpation on course or insertion of plantar fascial along the course of the posterior tibial, flexor tendons.  There is no area tenderness identified today.  Flexor, extensor tendons appear to be intact. No open lesions or pre-ulcerative lesions.  No pain with calf compression, swelling, warmth, erythema  Assessment: 49 year old female with resolving right foot plantar fasciitis, posterior tibial tendinitis  Plan: -All treatment options discussed with the patient including all alternatives, risks, complications.  -Overall doing much better.  I want her to continue stretching, icing exercises daily.  Ultimately discussed shoe modifications and orthotics.  We will check orthotic benefit coverage for her on her know.  I will see her back in 2 months as needed or sooner if any issues are to arise. -Patient encouraged to call the office with any questions, concerns, change in symptoms.   Trula Slade DPM

## 2019-11-09 NOTE — Patient Instructions (Signed)

## 2020-01-11 ENCOUNTER — Ambulatory Visit: Payer: BC Managed Care – PPO | Admitting: Podiatry

## 2020-03-21 ENCOUNTER — Other Ambulatory Visit: Payer: Self-pay

## 2020-03-21 ENCOUNTER — Ambulatory Visit (INDEPENDENT_AMBULATORY_CARE_PROVIDER_SITE_OTHER): Payer: BC Managed Care – PPO | Admitting: Family Medicine

## 2020-03-21 ENCOUNTER — Encounter: Payer: Self-pay | Admitting: Family Medicine

## 2020-03-21 VITALS — BP 133/79 | HR 65 | Wt 240.0 lb

## 2020-03-21 DIAGNOSIS — H6122 Impacted cerumen, left ear: Secondary | ICD-10-CM | POA: Diagnosis not present

## 2020-03-21 DIAGNOSIS — L720 Epidermal cyst: Secondary | ICD-10-CM

## 2020-03-21 DIAGNOSIS — I10 Essential (primary) hypertension: Secondary | ICD-10-CM | POA: Diagnosis not present

## 2020-03-21 DIAGNOSIS — D509 Iron deficiency anemia, unspecified: Secondary | ICD-10-CM | POA: Diagnosis not present

## 2020-03-21 DIAGNOSIS — Z1211 Encounter for screening for malignant neoplasm of colon: Secondary | ICD-10-CM

## 2020-03-21 NOTE — Progress Notes (Addendum)
Established Patient Office Visit  Subjective:  Patient ID: Kimberly Gamble, female    DOB: Sep 10, 1970  Age: 49 y.o. MRN: [id number]  CC:  Chief Complaint  Patient presents with  . Hyperlipidemia  . Mass    HPI Kimberly Gamble presents for   Hypertension- Pt denies chest pain, SOB, dizziness, or heart palpitations.  Taking meds as directed w/o problems.  Denies medication side effects.    Follow-up iron deficiency anemia-she is not currently on an iron supplement but does take a daily gummy multivitamin.  He also reports about 2 weeks ago she noticed a small little bump under her right breast where her underwire sits.  She says been little bit itchy but otherwise not bothersome.  She does not feel like it is changing or getting larger she says her maternal great great aunt had breast cancer but otherwise no immediate family member.  Also wants me to look in her left ear today she says it seems to be clogged but is coming and going she does get some temporary relief when she takes Claritin.  Past Medical History:  Diagnosis Date  . Family history of adverse reaction to anesthesia    mother developed high fever after right TKA at [facility] (07/2010 records did not suggest any concern for suspected anesthesia reaction)  . HLD (hyperlipidemia)    mild  . HTN (hypertension)    benign  . Sleep apnea     Past Surgical History:  Procedure Laterality Date  . ABDOMINAL HYSTERECTOMY  03/06/2019  . caesarean section    . CESAREAN SECTION    . CYSTOSCOPY N/A 03/06/2019   Procedure: Cystoscopy;  Surgeon: Emily Filbert, MD;  Location: [place];  Service: Gynecology;  Laterality: N/A;  . EXCISION OF SKIN TAG Right 03/06/2019   Procedure: Excision Of Skin Tag;  Surgeon: Emily Filbert, MD;  Location: [place];  Service: Gynecology;  Laterality: Right;  . HYSTERECTOMY ABDOMINAL WITH SALPINGECTOMY Bilateral 03/06/2019   Procedure: HYSTERECTOMY ABDOMINAL WITH SALPINGECTOMY;  Surgeon:  Emily Filbert, MD;  Location: [place];  Service: Gynecology;  Laterality: Bilateral;  sTARTED WITH SPINAL ANESTHESIA SWITCHED TO GENERAL ANESTHESIA AT  . TUBAL LIGATION      Family History  Problem Relation Age of Onset  . Heart attack Other   . Stroke Other     Social History   Socioeconomic History  . Marital status: Married    Spouse name: Not on file  . Number of children: Not on file  . Years of education: Not on file  . Highest education level: Not on file  Occupational History  . Occupation: Recruitment consultant for school    Tobacco Use  . Smoking status: Current Every Day Smoker    Packs/day: 0.30    Years: 15.00    Pack years: 4.50    Types: Cigarettes  . Smokeless tobacco: Never Used  . Tobacco comment: Per pt, smokes 4 cigs a day  Vaping Use  . Vaping Use: Never used  Substance and Sexual Activity  . Alcohol use: No    Alcohol/week: 0.0 standard drinks  . Drug use: No  . Sexual activity: Not on file  Other Topics Concern  . Not on file  Social History Narrative   Regularly exercises 3 days per week. 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 GED. In college. Has been walking for exercise.     Social Determinants of Health   Financial Resource Strain:   .  Difficulty of Paying Living Expenses: Not on file  Food Insecurity:   . Worried About Charity fundraiser in the Last Year: Not on file  . Ran Out of Food in the Last Year: Not on file  Transportation Needs:   . Lack of Transportation (Medical): Not on file  . Lack of Transportation (Non-Medical): Not on file  Physical Activity:   . Days of Exercise per Week: Not on file  . Minutes of Exercise per Session: Not on file  Stress:   . Feeling of Stress : Not on file  Social Connections:   . Frequency of Communication with Friends and Family: Not on file  . Frequency of Social Gatherings with Friends and Family: Not on file  . Attends Religious Services: Not on file  . Active Member of Clubs or Organizations: Not on file   . Attends Archivist Meetings: Not on file  . Marital Status: Not on file  Intimate Partner Violence:   . Fear of Current or Ex-Partner: Not on file  . Emotionally Abused: Not on file  . Physically Abused: Not on file  . Sexually Abused: Not on file    Outpatient Medications Prior to Visit  Medication Sig Dispense Refill  . AMBULATORY NON FORMULARY MEDICATION Medication Name: CPAP set to autopap 4-10 cm water pressure.  Call/Fax download in one week.  Fax to 1 vial 0  . docusate sodium (COLACE) 100 MG capsule Take 1 capsule (100 mg total) by mouth 2 (two) times daily. 60 capsule 0  . fluticasone (FLONASE) 50 MCG/ACT nasal spray Place 2 sprays into both nostrils daily. 16 g 6  . levocetirizine (XYZAL ALLERGY 24HR) 5 MG tablet Take 1 tablet (5 mg total) by mouth every evening. 90 tablet 3  . lisinopril-hydrochlorothiazide (ZESTORETIC) 20-12.5 MG tablet TAKE 1 TABLET BY MOUTH DAILY 90 tablet 1  . Multiple Vitamin (MULTIVITAMIN WITH MINERALS) TABS tablet Take 1 tablet by mouth daily.    . Multiple Vitamins-Minerals (HAIR/SKIN/NAILS) CAPS Take 1 capsule by mouth daily.    Marland Kitchen rOPINIRole (REQUIP) 0.25 MG tablet Take 1-2 tablets (0.25-0.5 mg total) by mouth at bedtime. 60 tablet 3   No facility-administered medications prior to visit.    No Known Allergies  ROS Review of Systems    Objective:    Physical Exam Constitutional:      Appearance: She is well-developed.  HENT:     Head: Normocephalic and atraumatic.  Cardiovascular:     Rate and Rhythm: Normal rate and regular rhythm.     Heart sounds: Normal heart sounds.  Pulmonary:     Effort: Pulmonary effort is normal.     Breath sounds: Normal breath sounds.  Skin:    General: Skin is warm and dry.     Comments: Small approximately 5 to 6 mm pigmented superficial papule.  Most consistent with an epidermal cyst.  The lot underneath the right breast  Neurological:     Mental Status: She is alert and oriented to person,  place, and time.  Psychiatric:        Behavior: Behavior normal.     BP 133/79 (BP Location: Left Arm, Patient Position: Sitting, Cuff Size: Large)   Pulse 65   Wt 240 lb (108.9 kg)   LMP 02/07/2019   SpO2 100%   BMI 39.94 kg/m  Wt Readings from Last 3 Encounters:  03/21/20 240 lb (108.9 kg)  05/15/19 247 lb 6.4 oz (112.2 kg)  04/16/19 250 lb (113.4 kg)  Health Maintenance Due  Topic Date Due  . Hepatitis C Screening  Never done    There are no preventive care reminders to display for this patient.  Lab Results  Component Value Date   TSH 0.76 07/07/2018   Lab Results  Component Value Date   WBC 4.6 06/27/2019   HGB 12.6 06/27/2019   HCT 39.0 06/27/2019   MCV 82.5 06/27/2019   PLT 232 06/27/2019   Lab Results  Component Value Date   NA 137 06/27/2019   K 3.5 06/27/2019   CO2 29 06/27/2019   GLUCOSE 92 06/27/2019   BUN 14 06/27/2019   CREATININE 0.67 06/27/2019   BILITOT 0.3 06/27/2019   ALKPHOS 43 04/08/2016   AST 14 06/27/2019   ALT 10 06/27/2019   PROT 6.6 06/27/2019   ALBUMIN 3.9 04/08/2016   CALCIUM 9.5 06/27/2019   ANIONGAP 7 03/02/2019   Lab Results  Component Value Date   CHOL 197 06/27/2019   Lab Results  Component Value Date   HDL 48 (L) 06/27/2019   Lab Results  Component Value Date   LDLCALC 126 (H) 06/27/2019   Lab Results  Component Value Date   TRIG 122 06/27/2019   Lab Results  Component Value Date   CHOLHDL 4.1 06/27/2019   Lab Results  Component Value Date   HGBA1C 5.2 04/08/2016      Assessment & Plan:   Problem List Items Addressed This Visit      Cardiovascular and Mediastinum   HYPERTENSION, BENIGN - Primary    Well controlled. Continue current regimen. Follow up in  6 mo due for BMP      Relevant Orders   BASIC METABOLIC PANEL WITH GFR     Other   Iron deficiency anemia    To recheck iron levels.      Relevant Orders   Fe+TIBC+Fer    Other Visit Diagnoses    Epidermal cyst       Hearing  loss secondary to cerumen impaction, left       Screen for colon cancer       Relevant Orders   Ambulatory referral to Gastroenterology     Discussed need for colon cancer screening based on new updated guidelines she is okay with colonoscopy after reviewing the choices.  Referral placed.  Epidermal cyst underneath the right breast-gave reassurance.  Recommend topical trial of benzyl peroxide over the next couple of weeks if it is getting larger or not resolving then please let us know it can be removed via excision.  But it is not appear to be worrisome for any type of breast mass or lump.  Cerumen impaction of the left ear-recommend a trial of Debrox drops. Recommend to pick up a bottle of Debrox drops.  Put 4 drops into that left ear twice a day for 4 days and then give her a break for 3 days.  Repeat the next week.  I can always check your ear when you come back or if you feel like it might need to be washed out before that then please let us know.  No orders of the defined types were placed in this encounter.   Follow-up: Return in about 6 months (around 09/18/2020) for Hypertension.    Beatrice Lecher, MD

## 2020-03-21 NOTE — Assessment & Plan Note (Signed)
To recheck iron levels.

## 2020-03-21 NOTE — Patient Instructions (Signed)
Recommend to pick up a bottle of Debrox drops.  Put 4 drops into that left ear twice a day for 4 days and then give her a break for 3 days.  Repeat the next week.  I can always check your ear when you come back or if you feel like it might need to be washed out before that then please let us know.

## 2020-03-21 NOTE — Progress Notes (Signed)
Pt found mass under right breast x 2 weeks.

## 2020-03-21 NOTE — Assessment & Plan Note (Signed)
Well controlled. Continue current regimen. Follow up in  6 mo due for BMP

## 2020-03-22 LAB — BASIC METABOLIC PANEL WITHOUT GFR
BUN: 10 mg/dL (ref 7–25)
CO2: 29 mmol/L (ref 20–32)
Calcium: 10.1 mg/dL (ref 8.6–10.2)
Chloride: 99 mmol/L (ref 98–110)
Creat: 0.7 mg/dL (ref 0.50–1.10)
GFR, Est African American: 118 mL/min/1.73m2
GFR, Est Non African American: 102 mL/min/1.73m2
Glucose, Bld: 92 mg/dL (ref 65–99)
Potassium: 4 mmol/L (ref 3.5–5.3)
Sodium: 135 mmol/L (ref 135–146)

## 2020-03-22 LAB — IRON,TIBC AND FERRITIN PANEL
%SAT: 28 % (ref 16–45)
Ferritin: 29 ng/mL (ref 16–232)
Iron: 95 ug/dL (ref 40–190)
TIBC: 337 ug/dL (ref 250–450)

## 2020-03-27 ENCOUNTER — Other Ambulatory Visit: Payer: Self-pay | Admitting: Family Medicine

## 2020-03-27 DIAGNOSIS — I10 Essential (primary) hypertension: Secondary | ICD-10-CM

## 2020-06-28 ENCOUNTER — Other Ambulatory Visit: Payer: Self-pay | Admitting: Family Medicine

## 2020-06-28 DIAGNOSIS — I10 Essential (primary) hypertension: Secondary | ICD-10-CM

## 2020-08-12 ENCOUNTER — Other Ambulatory Visit: Payer: Self-pay | Admitting: Family Medicine

## 2020-08-12 DIAGNOSIS — Z1231 Encounter for screening mammogram for malignant neoplasm of breast: Secondary | ICD-10-CM

## 2020-09-18 ENCOUNTER — Ambulatory Visit: Payer: BC Managed Care – PPO

## 2020-09-18 ENCOUNTER — Encounter: Payer: Self-pay | Admitting: Family Medicine

## 2020-09-18 ENCOUNTER — Ambulatory Visit (INDEPENDENT_AMBULATORY_CARE_PROVIDER_SITE_OTHER): Payer: BC Managed Care – PPO | Admitting: Family Medicine

## 2020-09-18 ENCOUNTER — Other Ambulatory Visit: Payer: Self-pay

## 2020-09-18 VITALS — BP 106/59 | HR 52 | Ht 65.0 in | Wt 227.0 lb

## 2020-09-18 DIAGNOSIS — G4733 Obstructive sleep apnea (adult) (pediatric): Secondary | ICD-10-CM

## 2020-09-18 DIAGNOSIS — I10 Essential (primary) hypertension: Secondary | ICD-10-CM

## 2020-09-18 DIAGNOSIS — Z23 Encounter for immunization: Secondary | ICD-10-CM

## 2020-09-18 DIAGNOSIS — F17200 Nicotine dependence, unspecified, uncomplicated: Secondary | ICD-10-CM

## 2020-09-18 DIAGNOSIS — R809 Proteinuria, unspecified: Secondary | ICD-10-CM

## 2020-09-18 MED ORDER — LISINOPRIL-HYDROCHLOROTHIAZIDE 10-12.5 MG PO TABS: 1.0000 | ORAL_TABLET | Freq: Every day | ORAL | 1 refills | Status: DC

## 2020-09-18 MED ORDER — AMBULATORY NON FORMULARY MEDICATION: 0 refills | Status: DC

## 2020-09-18 MED ORDER — VARENICLINE TARTRATE 1 MG PO TABS: ORAL_TABLET | ORAL | 0 refills | Status: AC

## 2020-09-18 NOTE — Assessment & Plan Note (Addendum)
Well controlled.  In fact blood pressure is a little bit low.  She has lost a fair amount of weight so I am wondering if we can go ahead and start backing off on her blood pressure pill.  We will get some up-to-date blood work today.  Follow-up in 4 months.

## 2020-09-18 NOTE — Progress Notes (Signed)
Established Patient Office Visit  Subjective:  Patient ID: Kimberly Gamble, female    DOB: 1970/07/15  Age: 50 y.o. MRN: [id number]  CC:  Chief Complaint  Patient presents with  . Hypertension    HPI Kimberly Gamble presents for 106-month follow-up.  Hypertension- Pt denies chest pain, SOB, dizziness, or heart palpitations.  Taking meds as directed w/o problems.  Denies medication side effects.    She is has scheduled her mammogram.  She also has her colonoscopy scheduled for next month when she is out of work.  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  She is still smoking and would like to discuss ways to quit.  She has already tried a nicotine replacement patch and says she did not do well with it.  She has also continued to lose weight which is fantastic.  He is down about 13 pounds since she was last here she says she is just really cut back on portion control cut back on sugary foods and is started to increase her exercise.  Obstructive obstructive sleep apnea-she admits she has not been as compliant with her CPAP she finds that she often pulls it off in the middle the night without even realizing that she is doing it.  She has to have at least a 80% usage rate to pass her TOT and she was rated 80%.  She currently gets her supplies through [facility].  Past Medical History:  Diagnosis Date  . Family history of adverse reaction to anesthesia    mother developed high fever after right TKA at [facility] (07/2010 records did not suggest any concern for suspected anesthesia reaction)  . HLD (hyperlipidemia)    mild  . HTN (hypertension)    benign  . Sleep apnea     Past Surgical History:  Procedure Laterality Date  . ABDOMINAL HYSTERECTOMY  03/06/2019  . caesarean section    . CESAREAN  SECTION    . CYSTOSCOPY N/A 03/06/2019   Procedure: Cystoscopy;  Surgeon: Emily Filbert, MD;  Location: [place];  Service: Gynecology;  Laterality: N/A;  . EXCISION OF SKIN TAG Right 03/06/2019   Procedure: Excision Of Skin Tag;  Surgeon: Emily Filbert, MD;  Location: [place];  Service: Gynecology;  Laterality: Right;  . HYSTERECTOMY ABDOMINAL WITH SALPINGECTOMY Bilateral 03/06/2019   Procedure: HYSTERECTOMY ABDOMINAL WITH SALPINGECTOMY;  Surgeon: Emily Filbert, MD;  Location: [place];  Service: Gynecology;  Laterality: Bilateral;  sTARTED WITH SPINAL ANESTHESIA SWITCHED TO GENERAL ANESTHESIA AT  . TUBAL LIGATION      Family History  Problem Relation Age of Onset  . Heart attack Other   . Stroke Other     Social History   Socioeconomic History  . Marital status: Married    Spouse name: Not on file  . Number of children: Not on file  . Years of education: Not on file  . Highest education level: Not on file  Occupational History  . Occupation: Recruitment consultant for school    Tobacco Use  . Smoking status: Current Every Day Smoker    Packs/day: 0.30    Years: 15.00    Pack years: 4.50    Types: Cigarettes  . Smokeless tobacco: Never Used  . Tobacco comment: Per  pt, smokes 4 cigs a day  Vaping Use  . Vaping Use: Never used  Substance and Sexual Activity  . Alcohol use: No    Alcohol/week: 0.0 standard drinks  . Drug use: No  . Sexual activity: Not on file  Other Topics Concern  . Not on file  Social History Narrative   Regularly exercises 3 days per week. 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 Has been walking for exercise.     Social Determinants of Health   Financial Resource Strain: Not on file  Food Insecurity: Not on file  Transportation Needs: Not on file  Physical Activity: Not on file  Stress: Not on file  Social Connections: Not on file  Intimate Partner Violence: Not on file    Outpatient Medications Prior to Visit  Medication Sig Dispense Refill  .  AMBULATORY NON FORMULARY MEDICATION Medication Name: CPAP set to autopap 4-10 cm water pressure.  Call/Fax download in one week.  Fax to 1 vial 0  . Ascorbic Acid (VITAMIN C) 100 MG tablet Take 100 mg by mouth daily.    . cholecalciferol (VITAMIN D3) 25 MCG (1000 UNIT) tablet Take 1,000 Units by mouth daily.    . fluticasone (FLONASE) 50 MCG/ACT nasal spray Place 2 sprays into both nostrils daily. 16 g 6  . levocetirizine (XYZAL ALLERGY 24HR) 5 MG tablet Take 1 tablet (5 mg total) by mouth every evening. 90 tablet 3  . Multiple Vitamin (MULTIVITAMIN WITH MINERALS) TABS tablet Take 1 tablet by mouth daily.    . Multiple Vitamins-Minerals (HAIR/SKIN/NAILS) CAPS Take 1 capsule by mouth daily.    Marland Kitchen rOPINIRole (REQUIP) 0.25 MG tablet Take 1-2 tablets (0.25-0.5 mg total) by mouth at bedtime. 60 tablet 3  . Turmeric-Ginger 135-6 MG CHEW Chew by mouth.    . vitamin B-12 (CYANOCOBALAMIN) 500 MCG tablet Take 500 mcg by mouth daily.    Marland Kitchen lisinopril-hydrochlorothiazide (ZESTORETIC) 20-12.5 MG tablet TAKE 1 TABLET BY MOUTH DAILY 90 tablet 1  . docusate sodium (COLACE) 100 MG capsule Take 1 capsule (100 mg total) by mouth 2 (two) times daily. 60 capsule 0   No facility-administered medications prior to visit.    No Known Allergies  ROS Review of Systems    Objective:    Physical Exam Constitutional:      Appearance: She is well-developed.  HENT:     Head: Normocephalic and atraumatic.  Cardiovascular:     Rate and Rhythm: Normal rate and regular rhythm.     Heart sounds: Normal heart sounds.  Pulmonary:     Effort: Pulmonary effort is normal.     Breath sounds: Normal breath sounds.  Skin:    General: Skin is warm and dry.  Neurological:     Mental Status: She is alert and oriented to person, place, and time.  Psychiatric:        Behavior: Behavior normal.     BP (!) 106/59   Pulse (!) 52   Ht 5\' 5"  (1.651 m)   Wt 227 lb (103 kg)   LMP 02/07/2019   SpO2 98%   BMI 37.77 kg/m  Wt  Readings from Last 3 Encounters:  09/18/20 227 lb (103 kg)  03/21/20 240 lb (108.9 kg)  05/15/19 247 lb 6.4 oz (112.2 kg)     Health Maintenance Due  Topic Date Due  . Hepatitis C Screening  Never done  . COLONOSCOPY (Pts 45-8yrs Insurance coverage will need to be confirmed)  Never done  .  COVID-19 Vaccine (3 - Booster for Pfizer series) 08/05/2020    There are no preventive care reminders to display for this patient.  Lab Results  Component Value Date   TSH 0.76 07/07/2018   Lab Results  Component Value Date   WBC 4.6 06/27/2019   HGB 12.6 06/27/2019   HCT 39.0 06/27/2019   MCV 82.5 06/27/2019   PLT 232 06/27/2019   Lab Results  Component Value Date   NA 135 03/21/2020   K 4.0 03/21/2020   CO2 29 03/21/2020   GLUCOSE 92 03/21/2020   BUN 10 03/21/2020   CREATININE 0.70 03/21/2020   BILITOT 0.3 06/27/2019   ALKPHOS 43 04/08/2016   AST 14 06/27/2019   ALT 10 06/27/2019   PROT 6.6 06/27/2019   ALBUMIN 3.9 04/08/2016   CALCIUM 10.1 03/21/2020   ANIONGAP 7 03/02/2019   Lab Results  Component Value Date   CHOL 197 06/27/2019   Lab Results  Component Value Date   HDL 48 (L) 06/27/2019   Lab Results  Component Value Date   LDLCALC 126 (H) 06/27/2019   Lab Results  Component Value Date   TRIG 122 06/27/2019   Lab Results  Component Value Date   CHOLHDL 4.1 06/27/2019   Lab Results  Component Value Date   HGBA1C 5.2 04/08/2016      Assessment & Plan:   Problem List Items Addressed This Visit      Cardiovascular and Mediastinum   HYPERTENSION, BENIGN - Primary    Well controlled.  In fact blood pressure is a little bit low.  She has lost a fair amount of weight so I am wondering if we can go ahead and start backing off on her blood pressure pill.  We will get some up-to-date blood work today.  Follow-up in 4 months.      Relevant Medications   lisinopril-hydrochlorothiazide (ZESTORETIC) 10-12.5 MG tablet   Other Relevant Orders   COMPLETE  METABOLIC PANEL WITH GFR   Lipid panel   CBC   Hepatitis C Antibody   Hemoglobin A1c   Urine Microalbumin w/creat. ratio     Respiratory   OSA (obstructive sleep apnea)    We will try to call [facility] to see if we can have her switch back to AutoPap for about a week and then get a download to see if we might be able to adjust her pressure.  Not sure of her current setting.      Relevant Medications   AMBULATORY NON FORMULARY MEDICATION     Other   TOBACCO ABUSE    [facility] options.  I think Chantix would be a great fit did discuss potential side effects.  New start prescription sent to pharmacy.  Quit date should be at the end of the first week.      Relevant Medications   varenicline (CHANTIX) 1 MG tablet   Morbid obesity ([place])    Great news.  BMI down to 37 which is phenomenal just encouraged her to keep up the great work.  Love to see her get down to less than 200 pounds if possible by the time I see her back in 6 months.       Other Visit Diagnoses    Proteinuria, unspecified type       Relevant Orders   Hemoglobin A1c   Urine Microalbumin w/creat. ratio   Need for tetanus, diphtheria, and acellular pertussis (Tdap) vaccine in patient of adolescent age or older  Relevant Orders   Tdap vaccine greater than or equal to 7yo IM (Completed)     Protineuria - we discussed repeating the Urine micro.  She also has had a bit of a urinary odor so we could also consider adding an UA or culture.  Negative dysuria  Meds ordered this encounter  Medications  . varenicline (CHANTIX) 1 MG tablet    Sig: Take 0.5 tablets (0.5 mg total) by mouth daily for 3 days, THEN 0.5 tablets (0.5 mg total) 2 (two) times daily for 4 days, THEN 1 tablet (1 mg total) 2 (two) times daily for 23 days.    Dispense:  52 tablet    Refill:  0  . lisinopril-hydrochlorothiazide (ZESTORETIC) 10-12.5 MG tablet    Sig: Take 1 tablet by mouth daily.    Dispense:  90 tablet    Refill:  1  . AMBULATORY NON  FORMULARY MEDICATION    Sig: Medication Name: Set CPAP to autopap 5-20 cm water pressure x 1 week and then fax [place] download. We are wanting to adjut her pressure since she has lost weigh.  Fax to [place]:  1 Units    Refill:  0     Follow-up: Return in about 4 months (around 01/19/2021) for Hypertension and CPAP.    Beatrice Lecher, MD

## 2020-09-18 NOTE — Assessment & Plan Note (Signed)
[facility] options.  I think Chantix would be a great fit did discuss potential side effects.  New start prescription sent to pharmacy.  Quit date should be at the end of the first week.

## 2020-09-18 NOTE — Assessment & Plan Note (Signed)
Great news.  BMI down to 37 which is phenomenal just encouraged her to keep up the great work.  Love to see her get down to less than 200 pounds if possible by the time I see her back in 6 months.

## 2020-09-18 NOTE — Assessment & Plan Note (Signed)
We will try to call [facility] to see if we can have her switch back to AutoPap for about a week and then get a download to see if we might be able to adjust her pressure.  Not sure of her current setting.

## 2020-09-19 LAB — COMPLETE METABOLIC PANEL WITH GFR
AG Ratio: 1.6 (calc) (ref 1.0–2.5)
ALT: 14 U/L (ref 6–29)
AST: 12 U/L (ref 10–35)
Albumin: 4.4 g/dL (ref 3.6–5.1)
Alkaline phosphatase (APISO): 56 U/L (ref 37–153)
BUN: 12 mg/dL (ref 7–25)
CO2: 28 mmol/L (ref 20–32)
Calcium: 10.2 mg/dL (ref 8.6–10.4)
Chloride: 100 mmol/L (ref 98–110)
Creat: 0.7 mg/dL (ref 0.50–1.05)
GFR, Est African American: 117 mL/min/{1.73_m2} (ref >=60)
GFR, Est Non African American: 101 mL/min/{1.73_m2} (ref >=60)
Globulin: 2.7 g/dL (calc) (ref 1.9–3.7)
Glucose, Bld: 87 mg/dL (ref 65–139)
Potassium: 4.2 mmol/L (ref 3.5–5.3)
Sodium: 135 mmol/L (ref 135–146)
Total Bilirubin: 0.6 mg/dL (ref 0.2–1.2)
Total Protein: 7.1 g/dL (ref 6.1–8.1)

## 2020-09-19 LAB — LIPID PANEL
Cholesterol: 221 mg/dL — ABNORMAL HIGH (ref <200)
HDL: 48 mg/dL — ABNORMAL LOW (ref >=50)
LDL Cholesterol (Calc): 150 mg/dL (calc) — ABNORMAL HIGH
Non-HDL Cholesterol (Calc): 173 mg/dL (calc) — ABNORMAL HIGH (ref <130)
Total CHOL/HDL Ratio: 4.6 (calc) (ref <5.0)
Triglycerides: 111 mg/dL (ref <150)

## 2020-09-19 LAB — CBC
HCT: 44.5 % (ref 35.0–45.0)
Hemoglobin: 15 g/dL (ref 11.7–15.5)
MCH: 30.5 pg (ref 27.0–33.0)
MCHC: 33.7 g/dL (ref 32.0–36.0)
MCV: 90.6 fL (ref 80.0–100.0)
MPV: 10.7 fL (ref 7.5–12.5)
Platelets: 223 10*3/uL (ref 140–400)
RBC: 4.91 10*6/uL (ref 3.80–5.10)
RDW: 12.1 % (ref 11.0–15.0)
WBC: 5.9 10*3/uL (ref 3.8–10.8)

## 2020-09-19 LAB — MICROALBUMIN / CREATININE URINE RATIO
Creatinine, Urine: 113 mg/dL (ref 20–275)
Microalb Creat Ratio: 3 mcg/mg creat (ref <30)
Microalb, Ur: 0.3 mg/dL

## 2020-09-19 LAB — HEPATITIS C ANTIBODY
Hepatitis C Ab: NONREACTIVE
SIGNAL TO CUT-OFF: 0 (ref <1.00)

## 2020-09-19 LAB — HEMOGLOBIN A1C
Hgb A1c MFr Bld: 5.3 % of total Hgb (ref <5.7)
Mean Plasma Glucose: 105 mg/dL
eAG (mmol/L): 5.8 mmol/L

## 2020-09-24 ENCOUNTER — Other Ambulatory Visit: Payer: Self-pay

## 2020-09-24 ENCOUNTER — Ambulatory Visit (INDEPENDENT_AMBULATORY_CARE_PROVIDER_SITE_OTHER): Payer: BC Managed Care – PPO

## 2020-09-24 DIAGNOSIS — Z1231 Encounter for screening mammogram for malignant neoplasm of breast: Secondary | ICD-10-CM

## 2020-09-26 ENCOUNTER — Other Ambulatory Visit: Payer: Self-pay

## 2020-09-26 ENCOUNTER — Ambulatory Visit (INDEPENDENT_AMBULATORY_CARE_PROVIDER_SITE_OTHER): Payer: BC Managed Care – PPO | Admitting: Podiatry

## 2020-09-26 ENCOUNTER — Ambulatory Visit (INDEPENDENT_AMBULATORY_CARE_PROVIDER_SITE_OTHER): Payer: BC Managed Care – PPO

## 2020-09-26 DIAGNOSIS — M79672 Pain in left foot: Secondary | ICD-10-CM | POA: Diagnosis not present

## 2020-09-26 DIAGNOSIS — M21612 Bunion of left foot: Secondary | ICD-10-CM | POA: Diagnosis not present

## 2020-09-26 DIAGNOSIS — M21619 Bunion of unspecified foot: Secondary | ICD-10-CM

## 2020-09-26 NOTE — Patient Instructions (Signed)

## 2020-09-26 NOTE — Progress Notes (Signed)
Subjective: 50 year old female presents the office for concerns of pain in her left foot.  She points on the area of bunion that causes discomfort particularly inside shoes.  She tried offloading for the bunion shoe modification without any improvement.  She would like to discuss surgery options for the bunion on the left foot.  Recently last couple months she started to develop pain on the bottom of her big toe pointing to the plantar hallux distal phalanx.  She is not sure if there was a blister or what was going on but she noticed it swollen there previously.  Denies having any foreign objects.  No swelling or redness otherwise.  No other concerns.  Right foot she states has been feeling great.  Denies any systemic complaints such as fevers, chills, nausea, vomiting. No acute changes since last appointment, and no other complaints at this time.   Objective: AAO x3, NAD DP/PT pulses palpable bilaterally, CRT less than 3 seconds Moderate to severe bunion is present of the left foot.  There is mild clicking sensation with range of motion of the first MPJ.  Hypermobility present of the first ray.  On the plantar aspect the hallux is a mild fatty tissue area which I think is coming from the pressure given the bunion when she is walking there is no blisters or open lesions.  No significant pain today submetatarsal 2.  No other area discomfort in the left foot.  No pain with calf compression, swelling, warmth, erythema  Assessment: Left foot bunion  Plan: -All treatment options discussed with the patient including all alternatives, risks, complications.  -We discussed with conservative as well as surgical treatment options.  Patient states that she is interested in having surgery for her bunion.  X-rays were obtained and reviewed today.  Severe bunion is present with subluxation of the first MPJ and displaced sesamoids. -Given the x-ray findings and she is attempted conservative treatment she was to  proceed with surgical intervention.  Discussed with her Lapidus bunionectomy versus first MPJ arthrodesis depending on what the joint looks like intraoperatively.  I discussed both the surgery as well as the recovery.  She wishes to proceed and we will make that determination intraoperatively. -The incision placement as well as the postoperative course was discussed with the patient. I discussed risks of the surgery which include, but not limited to, infection, bleeding, pain, swelling, need for further surgery, delayed or nonhealing, painful or ugly scar, numbness or sensation changes, over/under correction, recurrence, transfer lesions, further deformity, hardware failure, DVT/PE, loss of toe/foot. Patient understands these risks and wishes to proceed with surgery. The surgical consent was reviewed with the patient all 3 pages were signed. No promises or guarantees were given to the outcome of the procedure. All questions were answered to the best of my ability. Before the surgery the patient was encouraged to call the office if there is any further questions. The surgery will be performed at the [facility] on an outpatient basis. -CAM boot dispensed for postoperative use -Patient encouraged to call the office with any questions, concerns, change in symptoms.   Trula Slade DPM

## 2020-09-29 ENCOUNTER — Telehealth: Payer: Self-pay

## 2020-09-29 NOTE — Telephone Encounter (Signed)
Received surgery paperwork from [place] office. I left a message on 09/26/2020 and 09/29/2020 for Kimberly Gamble to call me so we can schedule her surgery.

## 2020-10-08 ENCOUNTER — Encounter: Payer: Self-pay | Admitting: Medical-Surgical

## 2020-10-08 ENCOUNTER — Telehealth (INDEPENDENT_AMBULATORY_CARE_PROVIDER_SITE_OTHER): Payer: BC Managed Care – PPO | Admitting: Medical-Surgical

## 2020-10-08 VITALS — Ht 65.0 in | Wt 224.0 lb

## 2020-10-08 DIAGNOSIS — J0100 Acute maxillary sinusitis, unspecified: Secondary | ICD-10-CM | POA: Diagnosis not present

## 2020-10-08 MED ORDER — IPRATROPIUM BROMIDE 0.03 % NA SOLN: 2.0000 | Freq: Two times a day (BID) | NASAL | 0 refills | Status: AC

## 2020-10-08 MED ORDER — AMOXICILLIN-POT CLAVULANATE 875-125 MG PO TABS: 1.0000 | ORAL_TABLET | Freq: Two times a day (BID) | ORAL | 0 refills | Status: DC

## 2020-10-08 NOTE — Progress Notes (Signed)
Virtual Visit via Video Note  I connected with Kimberly Gamble on 10/08/20 at  1:20 PM EDT by a video enabled telemedicine application and verified that I am speaking with the correct person using two identifiers.   I discussed the limitations of evaluation and management by telemedicine and the availability of in person appointments. The patient expressed understanding and agreed to proceed.  Patient location: home Provider locations: office  Subjective:    CC: Facial swelling  HPI: Pleasant 50 year old female presenting via MyChart video visit with reports of right-sided facial swelling, sore throat, and nasal drainage.  She has had the symptoms since Sunday but notes that she did have some sinus congestion for several days over the past week.  Notes that she has excessive right eye tearing as well as right sided nasal discharge.  Had the symptoms several years ago and was determined to be a sinus infection.  Since her onset of worsening symptoms over the weekend, she has used Flonase, Aleve, and ice packs.  She has seen some improvement in the swelling but not complete resolution.  Denies fever, chills, GI symptoms, shortness of breath, cough, and chest pain.  Has not tested for COVID but is vaccinated and has received her booster.  Also notes that she had COVID at the end of 2020.  Past medical history, Surgical history, Family history not pertinant except as noted below, Social history, Allergies, and medications have been entered into the medical record, reviewed, and corrections made.   Review of Systems: See HPI for pertinent positives and negatives.   Objective:    General: Speaking clearly in complete sentences without any shortness of breath.  Alert and oriented x3.  Normal judgment. No apparent acute distress.  Right-sided facial swelling most notably in the maxillary area.  Impression and Recommendations:    1. Acute non-recurrent maxillary sinusitis Since she has had  weekly congestion with worsening over the last few days, sending in Augmentin twice daily x7 days.  Also sending in Atrovent to help with the right-sided rhinorrhea.  Okay to continue with Aleve/Tylenol for discomfort as well as using ice packs if this is helpful.  Although this is an atypical presentation for COVID-19, recommend that she do a home test to make sure that she is negative.  I discussed the assessment and treatment plan with the patient. The patient was provided an opportunity to ask questions and all were answered. The patient agreed with the plan and demonstrated an understanding of the instructions.   The patient was advised to call back or seek an in-person evaluation if the symptoms worsen or if the condition fails to improve as anticipated.  20 minutes of non-face-to-face time was provided during this encounter.  Return if symptoms worsen or fail to improve.  Clearnce Sorrel, DNP, APRN, FNP-BC [facility]

## 2020-10-08 NOTE — Progress Notes (Signed)
Sore throat, nose drainage.  Pt states it feels just like the prior sinus infection she had. Will give more vital signs at appt.  Pt scheduled for colonoscopy next month. Pt has not had shingles vaccine.

## 2020-10-12 IMAGING — MG DIGITAL SCREENING BILAT W/ TOMO W/ CAD
8 of 14 series · 8 of 40 positions shown · non-contrast
Comparison: Previous exam(s).

CLINICAL DATA: Screening.

EXAM:
DIGITAL SCREENING BILATERAL MAMMOGRAM WITH TOMO AND CAD

[R CC synth-2D (1 of 2)]
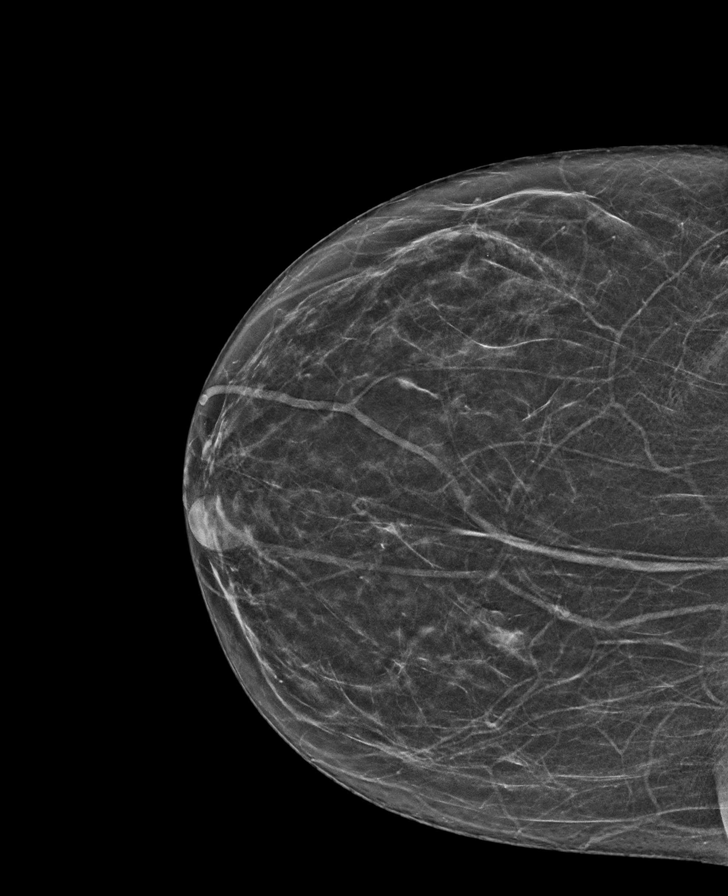

[R MLO synth-2D (1 of 2)]
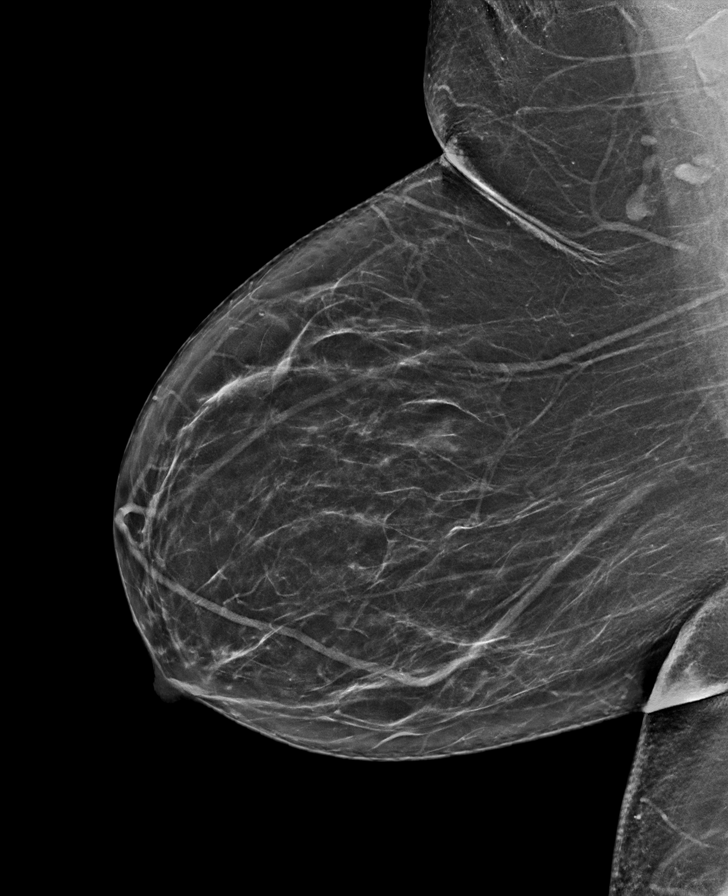

[L CC synth-2D]
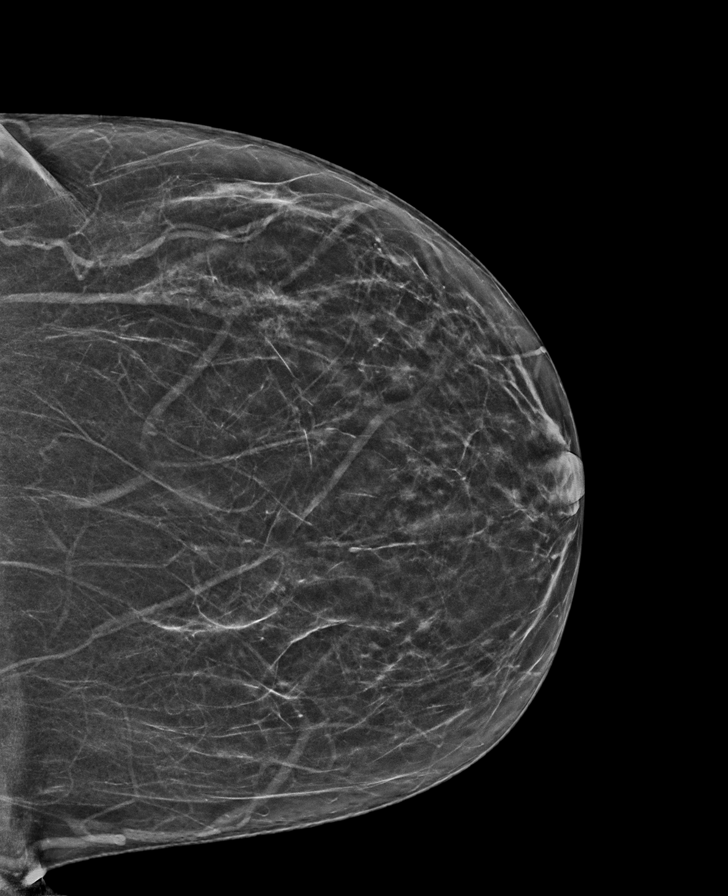

[L MLO synth-2D (1 of 2)]
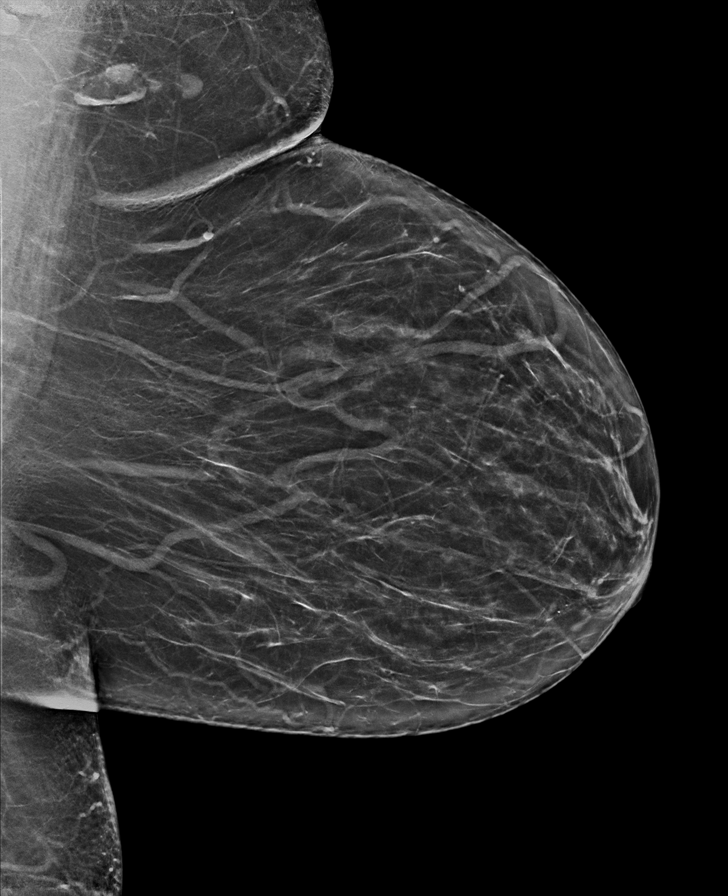

[R CC synth-2D (2 of 2)]
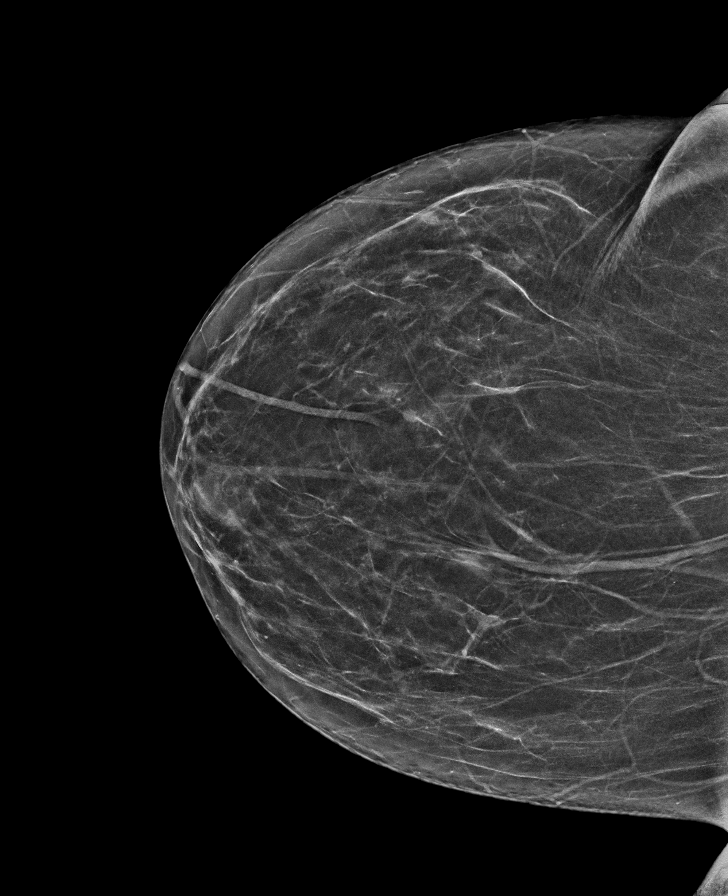

[R MLO synth-2D (2 of 2)]
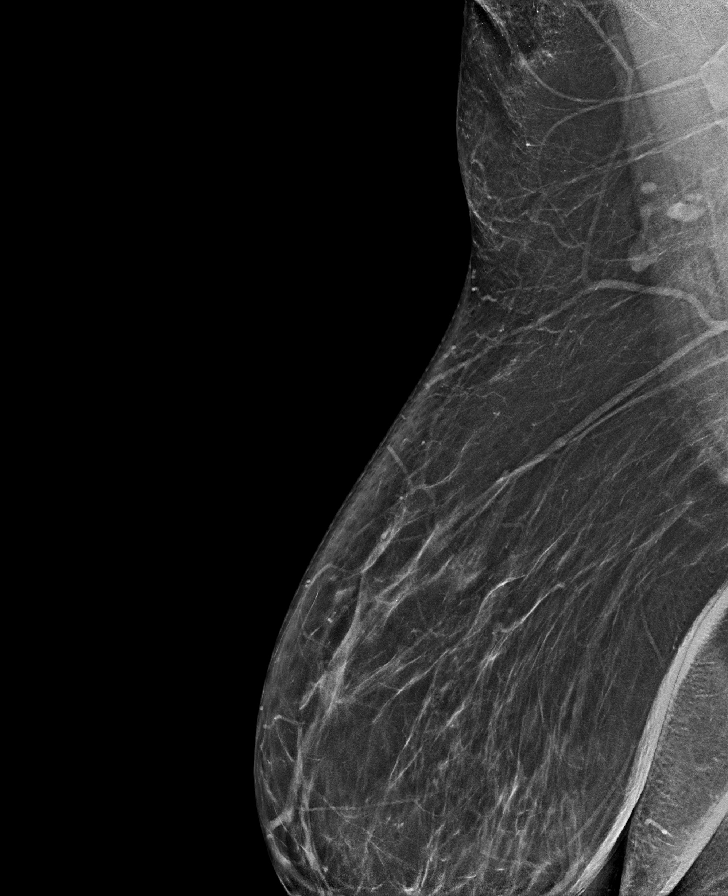

[L MLO synth-2D (2 of 2)]
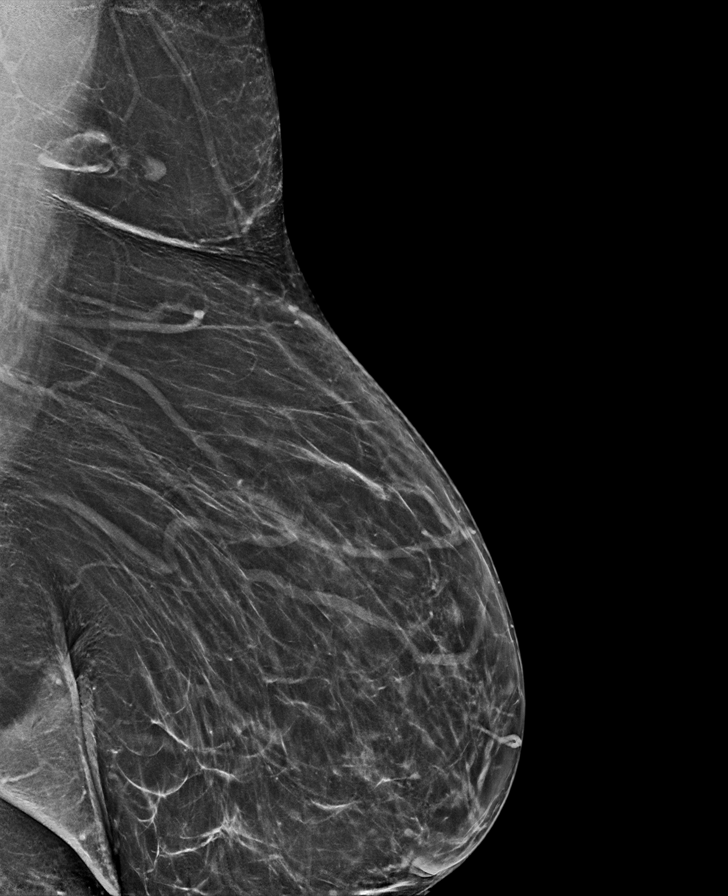

[R MLO tomo · tomo slice 37/73.0]
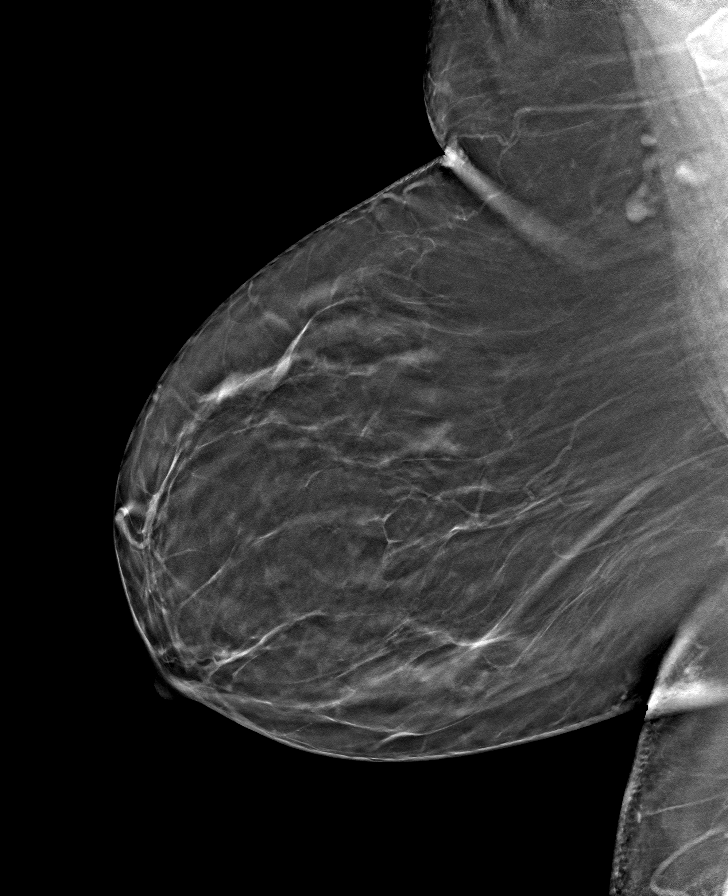

[8 of 40 positions shown; findings below may reference images not displayed]

ACR Breast Density Category b: There are scattered areas of
fibroglandular density.
FINDINGS: There are no findings suspicious for malignancy. Images were
processed with CAD.
IMPRESSION: No mammographic evidence of malignancy. A result letter of this
screening mammogram will be mailed directly to the patient.

RECOMMENDATION:
Screening mammogram in one year. (Code:[id number])

BI-RADS CATEGORY  1: Negative.

## 2020-12-15 ENCOUNTER — Telehealth: Payer: Self-pay | Admitting: Urology

## 2020-12-15 NOTE — Telephone Encounter (Signed)
DOS - 12/31/20  LAPIDUS PROCEDURE INCLUDING BUNIONECTOMY LT --- [id number] HALLUX MPJ FUSION LEFT --- [id number]   BCBS EFFECTIVE DATE - 05/10/20   PLAN DEDUCTIBLE - $1,500.00 W/ $1,500.00 REMAINING OUT OF POCKET - $5,900.00 W/ $5,749.79 REMAINING COINSURANCE - 30%  COPAY - $0.00   NO PRIOR AUTH IS REQUIRED

## 2020-12-31 ENCOUNTER — Other Ambulatory Visit: Payer: Self-pay | Admitting: Podiatry

## 2020-12-31 ENCOUNTER — Encounter: Payer: Self-pay | Admitting: Podiatry

## 2020-12-31 DIAGNOSIS — M2012 Hallux valgus (acquired), left foot: Secondary | ICD-10-CM | POA: Diagnosis not present

## 2020-12-31 MED ORDER — OXYCODONE-ACETAMINOPHEN 5-325 MG PO TABS: 1.0000 | ORAL_TABLET | Freq: Four times a day (QID) | ORAL | 0 refills | Status: DC | PRN

## 2020-12-31 MED ORDER — CEPHALEXIN 500 MG PO CAPS: 500.0000 mg | ORAL_CAPSULE | Freq: Three times a day (TID) | ORAL | 0 refills | Status: DC

## 2020-12-31 MED ORDER — ONDANSETRON HCL 4 MG PO TABS: 4.0000 mg | ORAL_TABLET | Freq: Three times a day (TID) | ORAL | 0 refills | Status: DC | PRN

## 2020-12-31 NOTE — Progress Notes (Signed)
Postop medications sent 

## 2021-01-02 ENCOUNTER — Telehealth: Payer: Self-pay | Admitting: *Deleted

## 2021-01-02 NOTE — Telephone Encounter (Signed)
Called and spoke with patient and stated that I was calling to see how the patient was doing after having surgery with Dr Jacqualyn Posey and patient stated that she was doing good and no fever or chills and not any nausea and could wiggle the toes and the nerve block is wearing off and the pain scale is 0 and I stated to call the office if any concerns or questions. Kimberly Gamble

## 2021-01-06 ENCOUNTER — Encounter: Payer: Self-pay | Admitting: Podiatry

## 2021-01-06 ENCOUNTER — Ambulatory Visit (INDEPENDENT_AMBULATORY_CARE_PROVIDER_SITE_OTHER): Payer: BC Managed Care – PPO

## 2021-01-06 ENCOUNTER — Ambulatory Visit (INDEPENDENT_AMBULATORY_CARE_PROVIDER_SITE_OTHER): Payer: BC Managed Care – PPO | Admitting: Podiatry

## 2021-01-06 ENCOUNTER — Other Ambulatory Visit: Payer: Self-pay

## 2021-01-06 VITALS — BP 114/67 | HR 64 | Temp 97.7°F | Resp 14

## 2021-01-06 DIAGNOSIS — Z9889 Other specified postprocedural states: Secondary | ICD-10-CM

## 2021-01-06 DIAGNOSIS — M21612 Bunion of left foot: Secondary | ICD-10-CM | POA: Diagnosis not present

## 2021-01-13 NOTE — Progress Notes (Signed)
Subjective: Kimberly Gamble is a 50 y.o. is seen today in office s/p left foot first MPJ arthrodesis preformed on 12/31/2020.  States that she is doing better and her pain is more controlled.  She is try not take any pain medication.  She is been nonweightbearing utilizing the cam boot.  Denies any systemic complaints such as fevers, chills, nausea, vomiting. No calf pain, chest pain, shortness of breath.   Objective: General: No acute distress, AAOx3  DP/PT pulses palpable 2/4, CRT < 3 sec to all digits.  Protective sensation intact. Motor function intact.  Left foot: Incision is well coapted without any evidence of dehiscence with sutures intact. There is no surrounding erythema, ascending cellulitis, fluctuance, crepitus, malodor, drainage/purulence. There is mild edema around the surgical site. There is minimal pain along the surgical site.  Toe is in rectus position. No other areas of tenderness to bilateral lower extremities.  No other open lesions or pre-ulcerative lesions.  No pain with calf compression, swelling, warmth, erythema.   Assessment and Plan:  Status post left foot first MPJ arthrodesis, doing well with no complications   -Treatment options discussed including all alternatives, risks, and complications -X-rays obtained and reviewed.  Hardware intact with any complicating factors. -Small amount of antibiotic ointment was applied followed by dressing.  Keep dressing clean, dry, intact -Remain nonweightbearing cam boot. -Ice/elevation -Pain medication as needed. -Monitor for any clinical signs or symptoms of infection and DVT/PE and directed to call the office immediately should any occur or go to the ER. -Follow-up as scheduled or sooner if any problems arise. In the meantime, encouraged to call the office with any questions, concerns, change in symptoms.   Celesta Gentile, DPM

## 2021-01-16 ENCOUNTER — Encounter: Payer: Self-pay | Admitting: Podiatry

## 2021-01-16 ENCOUNTER — Ambulatory Visit (INDEPENDENT_AMBULATORY_CARE_PROVIDER_SITE_OTHER): Payer: BC Managed Care – PPO | Admitting: Podiatry

## 2021-01-16 ENCOUNTER — Other Ambulatory Visit: Payer: Self-pay

## 2021-01-16 DIAGNOSIS — Z9889 Other specified postprocedural states: Secondary | ICD-10-CM

## 2021-01-16 DIAGNOSIS — M21612 Bunion of left foot: Secondary | ICD-10-CM

## 2021-01-19 ENCOUNTER — Ambulatory Visit: Payer: Self-pay | Admitting: Family Medicine

## 2021-01-21 NOTE — Progress Notes (Signed)
Subjective: Kimberly Gamble is a 50 y.o. is seen today in office s/p left foot first MPJ arthrodesis preformed on 12/31/2020.  She states that she is doing well although the boot is heavy.  Not having significant pain to the surgical site.  She has not had any recent injury.  She is in the cam boot and trying to stay off of the foot as much as possible.  No fevers or chills.  No other concerns.   Objective: General: No acute distress, AAOx3  DP/PT pulses palpable 2/4, CRT < 3 sec to all digits.  Protective sensation intact. Motor function intact.  Left foot: Incision is well coapted without any evidence of dehiscence with sutures intact.  There is no surrounding erythema, ascending cellulitis.  No drainage or pus.  There is no signs of infection.  Arthrodesis site appears to be stable.  Toes in rectus position.  Decreased edema. No other open lesions or pre-ulcerative lesions.  No pain with calf compression, swelling, warmth, erythema.   Assessment and Plan:  Status post left foot first MPJ arthrodesis, doing well with no complications   -Treatment options discussed including all alternatives, risks, and complications -At this point she can start to wash the foot with soap and water daily.  Apply a small amount of antibiotic ointment followed by dressing.  Continue cam boot, nonweightbearing.  Ice elevation.  To continue compression to help with any postoperative edema as well. -Monitor for any clinical signs or symptoms of infection and directed to call the office immediately should any occur or go to the ER.  Return in about 2 weeks (around 01/30/2021) for post-op visit/x-ray.  Trula Slade DPM

## 2021-01-30 ENCOUNTER — Ambulatory Visit (INDEPENDENT_AMBULATORY_CARE_PROVIDER_SITE_OTHER): Payer: BC Managed Care – PPO | Admitting: Podiatry

## 2021-01-30 ENCOUNTER — Ambulatory Visit (INDEPENDENT_AMBULATORY_CARE_PROVIDER_SITE_OTHER): Payer: BC Managed Care – PPO

## 2021-01-30 ENCOUNTER — Other Ambulatory Visit: Payer: Self-pay

## 2021-01-30 ENCOUNTER — Encounter: Payer: Self-pay | Admitting: Podiatry

## 2021-01-30 DIAGNOSIS — Z9889 Other specified postprocedural states: Secondary | ICD-10-CM

## 2021-01-30 DIAGNOSIS — M79672 Pain in left foot: Secondary | ICD-10-CM

## 2021-01-30 DIAGNOSIS — M21612 Bunion of left foot: Secondary | ICD-10-CM

## 2021-01-30 DIAGNOSIS — T8130XA Disruption of wound, unspecified, initial encounter: Secondary | ICD-10-CM

## 2021-01-30 MED ORDER — CEPHALEXIN 500 MG PO CAPS: 500.0000 mg | ORAL_CAPSULE | Freq: Three times a day (TID) | ORAL | 0 refills | Status: DC

## 2021-02-05 NOTE — Progress Notes (Signed)
Subjective: Kimberly Gamble is a 50 y.o. is seen today in office s/p left foot first MPJ arthrodesis preformed on 12/31/2020.  States that she been doing well.  She not having significant discomfort in the boot is helping but she feels it is rubbing the top of the incision some.  No recent injury or falls.  No fevers or chills.  No other concerns.   Objective: General: No acute distress, AAOx3  DP/PT pulses palpable 2/4, CRT < 3 sec to all digits.  Protective sensation intact. Motor function intact.  Left foot: Incision is well coapted without any evidence of dehiscence the distal portion.  On the proximal ankle incision approximately 1.3 cm in length superficial dehiscence.  There is no exposed bone or tendon.  No exposed hardware.  Minimal edema.  No surrounding erythema, ascending cellulitis.  No fluctuance or crepitation.  No malodor.  Arthrodesis site appears to be stable. No other open lesions or pre-ulcerative lesions.  No pain with calf compression, swelling, warmth, erythema.   Assessment and Plan:  Status post left foot first MPJ arthrodesis, doing well with no complications   -Treatment options discussed including all alternatives, risks, and complications -I did clean some of the scab that was present along the superficial dehiscence on the proximal aspect of incision.  There is no probing to bone or exposed hardware tendon.  I did prescribe cephalexin and recommended antibiotic ointment dressing changes daily.  Continuing cam boot recommend nonweightbearing, ice elevation.  Discussed smoking cessation.  There is no active drainage in order to culture today. -Monitor for any clinical signs or symptoms of infection and directed to call the office immediately should any occur or go to the ER.  Return in about 1 week (around 02/06/2021).  Trula Slade DPM

## 2021-02-06 ENCOUNTER — Ambulatory Visit (INDEPENDENT_AMBULATORY_CARE_PROVIDER_SITE_OTHER): Payer: BC Managed Care – PPO | Admitting: Podiatry

## 2021-02-06 ENCOUNTER — Other Ambulatory Visit: Payer: Self-pay

## 2021-02-06 DIAGNOSIS — T8130XA Disruption of wound, unspecified, initial encounter: Secondary | ICD-10-CM

## 2021-02-06 DIAGNOSIS — Z9889 Other specified postprocedural states: Secondary | ICD-10-CM

## 2021-02-06 DIAGNOSIS — M21612 Bunion of left foot: Secondary | ICD-10-CM

## 2021-02-06 NOTE — Progress Notes (Signed)
Subjective: Kimberly Gamble is a 50 y.o. is seen today in office s/p left foot first MPJ arthrodesis preformed on 12/31/2020.  States that since last appointment she has not changed the bandage.  No significant pain although she does feel a pulling on the incision of the bandage which causes some discomfort but no significant pain.  She has been in the cam boot.  No fevers or chills that she reports no other concerns.    Objective: General: No acute distress, AAOx3  DP/PT pulses palpable 2/4, CRT < 3 sec to all digits.  Protective sensation intact. Motor function intact.  Left foot: Incision is well coapted without any evidence of dehiscence the distal portion.  The area dehiscence is approximately 1 cm in length with a granular wound base.  There is no surrounding erythema, ascending cellulitis.  No fluctuance or crepitation.  There is no malodor.  No significant drainage.  No exposed bone or hardware tendon. No pain with calf compression, swelling, warmth, erythema.   Assessment and Plan:  Status post left foot first MPJ arthrodesis, superficial dehiscence  -Treatment options discussed including all alternatives, risks, and complications -Clean the superficial dehiscence today.  No obvious signs of infection.  Finish course of antibiotics.  Discussed with her change the bandage at least every other day.  She can wash it gently with soap and water and dry thoroughly and apply a small amount of antibiotic ointment and a bandage.  Continue cam boot, elevation.  Smoking cessation.  Trula Slade DPM

## 2021-02-12 ENCOUNTER — Ambulatory Visit: Payer: BC Managed Care – PPO | Admitting: Podiatry

## 2021-02-12 ENCOUNTER — Ambulatory Visit (INDEPENDENT_AMBULATORY_CARE_PROVIDER_SITE_OTHER): Payer: BC Managed Care – PPO | Admitting: Podiatry

## 2021-02-12 ENCOUNTER — Encounter: Payer: Self-pay | Admitting: Podiatry

## 2021-02-12 ENCOUNTER — Other Ambulatory Visit: Payer: Self-pay

## 2021-02-12 DIAGNOSIS — T8130XA Disruption of wound, unspecified, initial encounter: Secondary | ICD-10-CM

## 2021-02-12 DIAGNOSIS — Z9889 Other specified postprocedural states: Secondary | ICD-10-CM

## 2021-02-15 NOTE — Progress Notes (Signed)
Subjective: Kimberly Gamble is a 50 y.o. is seen today in office s/p left foot first MPJ arthrodesis preformed on 12/31/2020.  She presents today for wound check.  She states that she has been doing well and the area is healing.  Not see any drainage or pus.  She not had any pain.  No fevers or chills.  She has no other concerns today.  She is doing well using the surgical boot.   Objective: General: No acute distress, AAOx3  DP/PT pulses palpable 2/4, CRT < 3 sec to all digits.  Protective sensation intact. Motor function intact.  Left foot: Incision is well coapted without any evidence of dehiscence the distal portion.  The area dehiscence is approximately 1 cm in length with a granular wound base.  There is tenderness and superficial.  There is no drainage or pus.  No surrounding erythema, ascending cellulitis.  No fluctuation crepitation.  No malodor.  Arthrodesis site is stable and there is minimal swelling to the foot otherwise. No pain with calf compression, swelling, warmth, erythema.   Assessment and Plan:  Status post left foot first MPJ arthrodesis, superficial dehiscence  -Treatment options discussed including all alternatives, risks, and complications -Clean the superficial dehiscence today.  Discussed she can wash the area soap and water daily.  Apply a small amount of antibiotic ointment followed by dressing.  Change the bandage daily.  Continue in surgical boot for now.  Continue to ice and elevate.  Return in about 2 weeks (around 02/26/2021).  X-ray next appointment  Trula Slade DPM

## 2021-02-16 ENCOUNTER — Encounter: Payer: Self-pay | Admitting: Podiatry

## 2021-02-18 ENCOUNTER — Ambulatory Visit: Payer: BC Managed Care – PPO | Admitting: Family Medicine

## 2021-03-02 ENCOUNTER — Ambulatory Visit (INDEPENDENT_AMBULATORY_CARE_PROVIDER_SITE_OTHER): Payer: BC Managed Care – PPO | Admitting: Podiatry

## 2021-03-02 ENCOUNTER — Ambulatory Visit (INDEPENDENT_AMBULATORY_CARE_PROVIDER_SITE_OTHER): Payer: BC Managed Care – PPO

## 2021-03-02 ENCOUNTER — Other Ambulatory Visit: Payer: Self-pay

## 2021-03-02 DIAGNOSIS — T8130XA Disruption of wound, unspecified, initial encounter: Secondary | ICD-10-CM

## 2021-03-02 DIAGNOSIS — M79672 Pain in left foot: Secondary | ICD-10-CM

## 2021-03-04 ENCOUNTER — Encounter: Payer: Self-pay | Admitting: Podiatry

## 2021-03-05 NOTE — Progress Notes (Signed)
Subjective: Kimberly Gamble is a 50 y.o. is seen today in office s/p left foot first MPJ arthrodesis preformed on 12/31/2020. States that she has been doing well and not having significant pain.  The wound is healed.  She still keeps the foot drop and she still been walking in the cam boot.  Minimal swelling.  No fevers or chills that she reports.  She has no other concerns today.  Objective: General: No acute distress, AAOx3  DP/PT pulses palpable 2/4, CRT < 3 sec to all digits.  Protective sensation intact. Motor function intact.  Left foot: Today appears that the wound is healed from dehiscence.  Scar is formed.  There is trace edema.  There is no erythema or warmth.  There is no tenderness palpation.  There are surgical site is stable.  No other areas of discomfort identified today.  MMT 5/5. No pain with calf compression, swelling, warmth, erythema.   Assessment and Plan:  Status post left foot first MPJ arthrodesis, superficial dehiscence  -Treatment options discussed including all alternatives, risks, and complications -X-rays obtained and reviewed.  No evidence of acute fracture.  Hardware intact.  Increased consolidation across the arthrodesis site. -At this point discussed with her starting to transition back to regular shoe.  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  Continue to ice and elevate as well as compression to help with any postoperative edema.  Return in about 6 weeks (around 04/13/2021).  Trula Slade DPM

## 2021-04-10 ENCOUNTER — Other Ambulatory Visit: Payer: Self-pay | Admitting: Family Medicine

## 2021-04-10 DIAGNOSIS — I10 Essential (primary) hypertension: Secondary | ICD-10-CM

## 2021-04-13 ENCOUNTER — Ambulatory Visit (INDEPENDENT_AMBULATORY_CARE_PROVIDER_SITE_OTHER): Payer: BC Managed Care – PPO

## 2021-04-13 ENCOUNTER — Other Ambulatory Visit: Payer: Self-pay

## 2021-04-13 ENCOUNTER — Ambulatory Visit (INDEPENDENT_AMBULATORY_CARE_PROVIDER_SITE_OTHER): Payer: BC Managed Care – PPO | Admitting: Podiatry

## 2021-04-13 DIAGNOSIS — Z9889 Other specified postprocedural states: Secondary | ICD-10-CM

## 2021-04-13 DIAGNOSIS — M79672 Pain in left foot: Secondary | ICD-10-CM

## 2021-04-13 DIAGNOSIS — M21612 Bunion of left foot: Secondary | ICD-10-CM | POA: Diagnosis not present

## 2021-04-14 NOTE — Progress Notes (Signed)
Subjective: Kimberly Gamble is a 50 y.o. is seen today in office s/p left foot first MPJ arthrodesis preformed on 12/31/2020.  States that she has been doing better since last appointment.  She has returned to work and she requires regular shoe at work when she is not working she wears the surgical boot.  No significant pain.  Incisions well-healed and she is not seeing any opening or drainage.  Swelling is also improved.  No fevers or chills that she reports.  Objective: General: No acute distress, AAOx3  DP/PT pulses palpable 2/4, CRT < 3 sec to all digits.  Protective sensation intact. Motor function intact.  Left foot: Incision is well-healed at this time.  Trace edema and appears to be improved.  Arthrodesis site stable.  There is no significant tenderness palpation on surgical site.  She presents today wearing a cam boot. No pain with calf compression, swelling, warmth, erythema.   Assessment and Plan:  Status post left foot first MPJ arthrodesis, superficial dehiscence  -Treatment options discussed including all alternatives, risks, and complications -X-rays obtained and reviewed.  No evidence of acute fracture.  Hardware intact.  Increased consolidation across the arthrodesis site. -As such conservative transition to regular shoe full-time.  Continue ice and elevate.  Continue with compression help with any postoperative edema.  Should there be any increase in pain resume the cam boot and wean down.  When she is back into a shoe she can increase activity as tolerated.  Return in about 6 weeks (around 05/25/2021).  Trula Slade DPM

## 2021-05-25 ENCOUNTER — Other Ambulatory Visit: Payer: Self-pay

## 2021-05-25 ENCOUNTER — Ambulatory Visit (INDEPENDENT_AMBULATORY_CARE_PROVIDER_SITE_OTHER): Payer: BC Managed Care – PPO | Admitting: Podiatry

## 2021-05-25 ENCOUNTER — Ambulatory Visit (INDEPENDENT_AMBULATORY_CARE_PROVIDER_SITE_OTHER): Payer: BC Managed Care – PPO

## 2021-05-25 DIAGNOSIS — Z9889 Other specified postprocedural states: Secondary | ICD-10-CM | POA: Diagnosis not present

## 2021-05-25 DIAGNOSIS — M21612 Bunion of left foot: Secondary | ICD-10-CM

## 2021-05-27 ENCOUNTER — Encounter: Payer: Self-pay | Admitting: Family Medicine

## 2021-05-27 ENCOUNTER — Ambulatory Visit: Payer: BC Managed Care – PPO | Admitting: Family Medicine

## 2021-05-27 ENCOUNTER — Other Ambulatory Visit: Payer: Self-pay

## 2021-05-27 VITALS — BP 123/76 | HR 55 | Resp 16 | Ht 65.0 in | Wt 219.0 lb

## 2021-05-27 DIAGNOSIS — H6123 Impacted cerumen, bilateral: Secondary | ICD-10-CM

## 2021-05-27 DIAGNOSIS — E785 Hyperlipidemia, unspecified: Secondary | ICD-10-CM

## 2021-05-27 DIAGNOSIS — I10 Essential (primary) hypertension: Secondary | ICD-10-CM

## 2021-05-27 DIAGNOSIS — H9312 Tinnitus, left ear: Secondary | ICD-10-CM

## 2021-05-27 NOTE — Progress Notes (Signed)
Established Patient Office Visit  Subjective:  Patient ID: Kimberly Gamble, female    DOB: 06/30/1970  Age: 51 y.o. MRN: [id number]  CC:  Chief Complaint  Patient presents with   Hypertension    Follow up    Tinnitus    Left ear off and on for several months     HPI Kimberly Gamble presents for   Hypertension- Pt denies chest pain, SOB, dizziness, or heart palpitations.  Taking meds as directed w/o problems.  Denies medication side effects.  We had cut her BP pill down 6 mo ago.    Also complains of left ear tinnitus on and off for several months. She is not having pain. She is not sure what is causing it. She has had problems with wax build up before.    BMI 36-she is still trying to work on her weight.  She is down about 5 more pounds since I last saw her.  She actually surprised herself because she actually had foot surgery with Dr. Earleen Newport back in the fall and unfortunately had some complications and wound dehiscence..  But she is doing well and is now fully released.  Past Medical History:  Diagnosis Date   Family history of adverse reaction to anesthesia    mother developed high fever after right TKA at [facility] (07/2010 records did not suggest any concern for suspected anesthesia reaction)   HLD (hyperlipidemia)    mild   HTN (hypertension)    benign   Sleep apnea     Past Surgical History:  Procedure Laterality Date   ABDOMINAL HYSTERECTOMY  03/06/2019   caesarean section     CESAREAN SECTION     CYSTOSCOPY N/A 03/06/2019   Procedure: Cystoscopy;  Surgeon: Emily Filbert, MD;  Location: [place];  Service: Gynecology;  Laterality: N/A;   EXCISION OF SKIN TAG Right 03/06/2019   Procedure: Excision Of Skin Tag;  Surgeon: Emily Filbert, MD;  Location: [place];  Service: Gynecology;  Laterality: Right;   HYSTERECTOMY ABDOMINAL WITH SALPINGECTOMY Bilateral 03/06/2019   Procedure: HYSTERECTOMY ABDOMINAL WITH SALPINGECTOMY;  Surgeon: Emily Filbert, MD;  Location:  [place];  Service: Gynecology;  Laterality: Bilateral;  sTARTED WITH SPINAL ANESTHESIA SWITCHED TO GENERAL ANESTHESIA AT   TUBAL LIGATION      Family History  Problem Relation Age of Onset   Heart attack Other    Stroke Other     Social History   Socioeconomic History   Marital status: Married    Spouse name: Not on file   Number of children: Not on file   Years of education: Not on file   Highest education level: Not on file  Occupational History   Occupation: Recruitment consultant for school    Tobacco Use   Smoking status: Every Day    Packs/day: 1.00    Years: 15.00    Pack years: 15.00    Types: Cigarettes   Smokeless tobacco: Never   Tobacco comments:    Patient states 1 pack lasts 3 days   Vaping Use   Vaping Use: Never used  Substance and Sexual Activity   Alcohol use: No    Alcohol/week: 0.0 standard drinks   Drug use: No   Sexual activity: Not on file  Other Topics Concern   Not on file  Social History Narrative   Regularly exercises 3 days per week. 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 GED. In college. Has been walking for exercise.  Social Determinants of Health   Financial Resource Strain: Not on file  Food Insecurity: Not on file  Transportation Needs: Not on file  Physical Activity: Not on file  Stress: Not on file  Social Connections: Not on file  Intimate Partner Violence: Not on file    Outpatient Medications Prior to Visit  Medication Sig Dispense Refill   [place] Medication Name: CPAP set to autopap 4-10 cm water pressure.  Call/Fax download in one week.  Fax to 1 vial 0   AMBULATORY NON FORMULARY MEDICATION Medication Name: Set CPAP to autopap 5-20 cm water pressure x 1 week and then fax [place] download. We are wanting to adjut her pressure since she has lost weigh.  Fax to [facility] 1 Units 0   Ascorbic Acid (VITAMIN C) 100 MG tablet Take 100 mg by mouth daily.     cholecalciferol (VITAMIN D3) 25 MCG (1000 UNIT) tablet Take  1,000 Units by mouth daily.     fluticasone (FLONASE) 50 MCG/ACT nasal spray Place 2 sprays into both nostrils daily. 16 g 6   ipratropium (ATROVENT) 0.03 % nasal spray Place 2 sprays into both nostrils every 12 (twelve) hours. 30 mL 0   levocetirizine (XYZAL ALLERGY 24HR) 5 MG tablet Take 1 tablet (5 mg total) by mouth every evening. 90 tablet 3   lisinopril-hydrochlorothiazide (ZESTORETIC) 10-12.5 MG tablet TAKE 1 TABLET BY MOUTH DAILY 90 tablet 1   Multiple Vitamin (MULTIVITAMIN WITH MINERALS) TABS tablet Take 1 tablet by mouth daily.     Multiple Vitamins-Minerals (HAIR/SKIN/NAILS) CAPS Take 1 capsule by mouth daily.     rOPINIRole (REQUIP) 0.25 MG tablet Take 1-2 tablets (0.25-0.5 mg total) by mouth at bedtime. 60 tablet 3   Turmeric-Ginger 135-6 MG CHEW Chew by mouth.     vitamin B-12 (CYANOCOBALAMIN) 500 MCG tablet Take 500 mcg by mouth daily.     cephALEXin (KEFLEX) 500 MG capsule Take 1 capsule (500 mg total) by mouth 3 (three) times daily. 21 capsule 0   oxyCODONE-acetaminophen (PERCOCET/ROXICET) 5-325 MG tablet Take 1-2 tablets by mouth every 6 (six) hours as needed for severe pain. 30 tablet 0   No facility-administered medications prior to visit.    Allergies  Allergen Reactions   Chantix [Varenicline] Anxiety    Increased anxiety    ROS Review of Systems    Objective:    Physical Exam Constitutional:      Appearance: Normal appearance. She is well-developed.  HENT:     Head: Normocephalic and atraumatic.     Ears:     Comments: TMs are blocked by cerumen bilaterally.  Cardiovascular:     Rate and Rhythm: Normal rate and regular rhythm.     Heart sounds: Normal heart sounds.  Pulmonary:     Effort: Pulmonary effort is normal.     Breath sounds: Normal breath sounds.  Skin:    General: Skin is warm and dry.  Neurological:     Mental Status: She is alert and oriented to person, place, and time.  Psychiatric:        Behavior: Behavior normal.    BP 123/76     Pulse (!) 55    Resp 16    Ht 5\' 5"  (1.651 m)    Wt 219 lb (99.3 kg)    LMP 02/07/2019    SpO2 99%    BMI 36.44 kg/m  Wt Readings from Last 3 Encounters:  05/27/21 219 lb (99.3 kg)  10/08/20 224 lb (101.6 kg)  09/18/20 227 lb (103 kg)     There are no preventive care reminders to display for this patient.  There are no preventive care reminders to display for this patient.  Lab Results  Component Value Date   TSH 0.76 07/07/2018   Lab Results  Component Value Date   WBC 5.9 09/18/2020   HGB 15.0 09/18/2020   HCT 44.5 09/18/2020   MCV 90.6 09/18/2020   PLT 223 09/18/2020   Lab Results  Component Value Date   NA 135 09/18/2020   K 4.2 09/18/2020   CO2 28 09/18/2020   GLUCOSE 87 09/18/2020   BUN 12 09/18/2020   CREATININE 0.70 09/18/2020   BILITOT 0.6 09/18/2020   ALKPHOS 43 04/08/2016   AST 12 09/18/2020   ALT 14 09/18/2020   PROT 7.1 09/18/2020   ALBUMIN 3.9 04/08/2016   CALCIUM 10.2 09/18/2020   ANIONGAP 7 03/02/2019   Lab Results  Component Value Date   CHOL 221 (H) 09/18/2020   Lab Results  Component Value Date   HDL 48 (L) 09/18/2020   Lab Results  Component Value Date   LDLCALC 150 (H) 09/18/2020   Lab Results  Component Value Date   TRIG 111 09/18/2020   Lab Results  Component Value Date   CHOLHDL 4.6 09/18/2020   Lab Results  Component Value Date   HGBA1C 5.3 09/18/2020      Assessment & Plan:   Problem List Items Addressed This Visit       Cardiovascular and Mediastinum   HYPERTENSION, BENIGN - Primary    Is really doing fantastic.  We actually reduced her blood pressure medication when I saw her last.  And she is doing great on it.  And she also continues to lose weight in fact he is down about 5 more pounds.      Relevant Orders   BASIC METABOLIC PANEL WITH GFR   Lipid Panel w/reflex Direct LDL     Other   Morbid obesity (HCC)    BMI 36 with comorbidities-she has really done fantastic in her weight loss journey.  Just really  encouraged her to continue to make those small changes and continue to work at.  She is doing really great on the decreased dose of blood pressure medication my hope is that we might even be able to get her completely off at some point.      Hyperlipidemia    He has been working to reduce her cholesterol and has lost weight.  She would like to go ahead and recheck her lipids to see if they look better.  She is also been taking some garlic.      Relevant Orders   BASIC METABOLIC PANEL WITH GFR   Lipid Panel w/reflex Direct LDL   Other Visit Diagnoses     Bilateral impacted cerumen       Tinnitus, left ear       Relevant Orders   Ambulatory referral to ENT      Tinnitus in the left ear she did have bilateral cerumen impaction upon exam.  We were able to irrigate the right completely but could not get the left to move.  So we will refer her to ENT for cerumen impaction and left-sided tinnitus.  No orders of the defined types were placed in this encounter.   Follow-up: Return in about 6 months (around 11/24/2021) for Hypertension follow up .    Beatrice Lecher, MD

## 2021-05-27 NOTE — Assessment & Plan Note (Signed)
BMI 36 with comorbidities-she has really done fantastic in her weight loss journey.  Just really encouraged her to continue to make those small changes and continue to work at.  She is doing really great on the decreased dose of blood pressure medication my hope is that we might even be able to get her completely off at some point.

## 2021-05-27 NOTE — Progress Notes (Signed)
Subjective: Kimberly Gamble is a 51 y.o. is seen today in office s/p left foot first MPJ arthrodesis preformed on 12/31/2020.  States that she is doing well.  She is back to wearing her regular shoe full-time without any issues.  She is she did have some occasional burning pain to the leg for short amount of time without went away and she is not having any pain now.  She thinks this was coming from when she was walking as she increased her activity.  Denies any cramping or swelling to her calf.  No chest pain or shortness of breath.  No fevers or chills.  No other concerns.   Objective: General: No acute distress, AAOx3  DP/PT pulses palpable 2/4, CRT < 3 sec to all digits.  Protective sensation intact. Motor function intact.  Left foot: Incision is well-healed at this time.  There is slight edema still present but improving.  No erythema or warmth or any signs of infection.  Arthrodesis site stable.  There is no significant tenderness palpation on surgical site. No pain with calf compression, swelling, warmth, erythema.   Assessment and Plan:  Status post left foot first MPJ arthrodesis  -Treatment options discussed including all alternatives, risks, and complications -X-rays obtained reviewed.  Hardware intact without any complicating factors.  Increased consolidation across the arthrodesis site. -She is back to her regular shoe full-time.  No current pain.  No signs of infection or any leg pain.  This point with discharge of the postoperative care she and she agrees with this plan.  Encouraged to call any questions or concerns.  Trula Slade DPM

## 2021-05-27 NOTE — Assessment & Plan Note (Signed)
He has been working to reduce her cholesterol and has lost weight.  She would like to go ahead and recheck her lipids to see if they look better.  She is also been taking some garlic.

## 2021-05-27 NOTE — Assessment & Plan Note (Signed)
Is really doing fantastic.  We actually reduced her blood pressure medication when I saw her last.  And she is doing great on it.  And she also continues to lose weight in fact he is down about 5 more pounds.

## 2021-05-28 LAB — LIPID PANEL W/REFLEX DIRECT LDL
Cholesterol: 219 mg/dL — ABNORMAL HIGH (ref <200)
HDL: 57 mg/dL (ref >=50)
LDL Cholesterol (Calc): 137 mg/dL (calc) — ABNORMAL HIGH
Non-HDL Cholesterol (Calc): 162 mg/dL (calc) — ABNORMAL HIGH (ref <130)
Total CHOL/HDL Ratio: 3.8 (calc) (ref <5.0)
Triglycerides: 127 mg/dL (ref <150)

## 2021-05-28 LAB — BASIC METABOLIC PANEL WITH GFR
BUN: 12 mg/dL (ref 7–25)
CO2: 32 mmol/L (ref 20–32)
Calcium: 9.5 mg/dL (ref 8.6–10.4)
Chloride: 101 mmol/L (ref 98–110)
Creat: 0.69 mg/dL (ref 0.50–1.03)
Glucose, Bld: 79 mg/dL (ref 65–99)
Potassium: 3.6 mmol/L (ref 3.5–5.3)
Sodium: 138 mmol/L (ref 135–146)
eGFR: 106 mL/min/{1.73_m2} (ref >=60)

## 2021-05-28 NOTE — Progress Notes (Signed)
Hi Kimberly Gamble, your LDL cholesterol has come back down.  This is great.  It had jumped up significantly about 8 months ago but it is trending back down again.  Love to see it get back under 120 similar to about 2 years ago.  Your triglycerides look good.  Your good cholesterol, HDL, also went up which is fantastic.  That is the one we want to be high.  Your metabolic panel looks good.

## 2021-11-24 ENCOUNTER — Ambulatory Visit (INDEPENDENT_AMBULATORY_CARE_PROVIDER_SITE_OTHER): Payer: BC Managed Care – PPO | Admitting: Family Medicine

## 2021-11-24 ENCOUNTER — Encounter: Payer: Self-pay | Admitting: Family Medicine

## 2021-11-24 VITALS — BP 118/58 | HR 60 | Ht 65.0 in | Wt 229.0 lb

## 2021-11-24 DIAGNOSIS — Z1211 Encounter for screening for malignant neoplasm of colon: Secondary | ICD-10-CM

## 2021-11-24 DIAGNOSIS — I10 Essential (primary) hypertension: Secondary | ICD-10-CM

## 2021-11-24 DIAGNOSIS — Z1231 Encounter for screening mammogram for malignant neoplasm of breast: Secondary | ICD-10-CM | POA: Diagnosis not present

## 2021-11-24 NOTE — Progress Notes (Signed)
   Established Patient Office Visit  Subjective   Patient ID: Kimberly Gamble, female    DOB: [birth year]  Age: 51 y.o. MRN: [id number]  Chief Complaint  Patient presents with   Hypertension    HPI  Hypertension- Pt denies chest pain, SOB, dizziness, or heart palpitations.  Taking meds as directed w/o problems.  Denies medication side effects.  She still walking for exercise 3 days a week.  She admits she has had some more stress recently and has been doing some stress eating.  She knew she had gained a little bit of weight.  Is okay with referral for screening colonoscopy.     ROS    Objective:     BP (!) 118/58   Pulse 60   Ht '5\' 5"'$  (1.651 m)   Wt 229 lb (103.9 kg)   LMP 02/07/2019   SpO2 95%   BMI 38.11 kg/m    Physical Exam Vitals and nursing note reviewed.  Constitutional:      Appearance: She is well-developed.  HENT:     Head: Normocephalic and atraumatic.  Cardiovascular:     Rate and Rhythm: Normal rate and regular rhythm.     Heart sounds: Normal heart sounds.  Pulmonary:     Effort: Pulmonary effort is normal.     Breath sounds: Normal breath sounds.  Skin:    General: Skin is warm and dry.  Neurological:     Mental Status: She is alert and oriented to person, place, and time.  Psychiatric:        Behavior: Behavior normal.      No results found for any visits on 11/24/21.    The 10-year ASCVD risk score (Arnett DK, et al., 2019) is: 5.5%    Assessment & Plan:   Problem List Items Addressed This Visit       Cardiovascular and Mediastinum   HYPERTENSION, BENIGN - Primary    Well controlled. Continue current regimen. Follow up in  6 mo       Relevant Orders   BASIC METABOLIC PANEL WITH GFR   Other Visit Diagnoses     Screening for malignant neoplasm of colon       Relevant Orders   Ambulatory referral to Gastroenterology   Screening mammogram for breast cancer       Relevant Orders   MM 3D SCREEN BREAST BILATERAL       Did encourage her to consider getting the shingles vaccine.  Referral made to GI for screening colonoscopy.  Encouraged her to schedule her mammogram appointment for this year.  Return in about 6 months (around 05/27/2022) for Hypertension and fasting labs. Beatrice Lecher, MD

## 2021-11-24 NOTE — Assessment & Plan Note (Signed)
Well controlled. Continue current regimen. Follow up in  6 mo  

## 2021-11-25 LAB — BASIC METABOLIC PANEL WITH GFR
BUN: 11 mg/dL (ref 7–25)
CO2: 30 mmol/L (ref 20–32)
Calcium: 9.5 mg/dL (ref 8.6–10.4)
Chloride: 104 mmol/L (ref 98–110)
Creat: 0.92 mg/dL (ref 0.50–1.03)
Glucose, Bld: 77 mg/dL (ref 65–99)
Potassium: 4.1 mmol/L (ref 3.5–5.3)
Sodium: 141 mmol/L (ref 135–146)
eGFR: 75 mL/min/{1.73_m2} (ref >=60)

## 2021-11-25 NOTE — Progress Notes (Signed)
Your lab work is within acceptable range and there are no concerning findings.   ?

## 2021-11-26 ENCOUNTER — Ambulatory Visit (INDEPENDENT_AMBULATORY_CARE_PROVIDER_SITE_OTHER): Payer: BC Managed Care – PPO

## 2021-11-26 DIAGNOSIS — Z1231 Encounter for screening mammogram for malignant neoplasm of breast: Secondary | ICD-10-CM | POA: Diagnosis not present

## 2021-11-30 NOTE — Progress Notes (Signed)
Please call patient. Normal mammogram.  Repeat in 1 year.  

## 2022-01-14 ENCOUNTER — Other Ambulatory Visit: Payer: Self-pay | Admitting: Neurology

## 2022-01-14 DIAGNOSIS — I10 Essential (primary) hypertension: Secondary | ICD-10-CM

## 2022-01-14 MED ORDER — LISINOPRIL-HYDROCHLOROTHIAZIDE 10-12.5 MG PO TABS: 1.0000 | ORAL_TABLET | Freq: Every day | ORAL | 1 refills | Status: DC

## 2022-02-12 LAB — HM COLONOSCOPY

## 2022-02-22 ENCOUNTER — Ambulatory Visit: Payer: BC Managed Care – PPO | Admitting: Sports Medicine

## 2022-02-22 ENCOUNTER — Ambulatory Visit (INDEPENDENT_AMBULATORY_CARE_PROVIDER_SITE_OTHER): Payer: BC Managed Care – PPO

## 2022-02-22 DIAGNOSIS — M170 Bilateral primary osteoarthritis of knee: Secondary | ICD-10-CM

## 2022-02-22 DIAGNOSIS — M25561 Pain in right knee: Secondary | ICD-10-CM | POA: Diagnosis not present

## 2022-02-22 DIAGNOSIS — M5412 Radiculopathy, cervical region: Secondary | ICD-10-CM

## 2022-02-22 DIAGNOSIS — M542 Cervicalgia: Secondary | ICD-10-CM | POA: Diagnosis not present

## 2022-02-22 DIAGNOSIS — M25562 Pain in left knee: Secondary | ICD-10-CM | POA: Diagnosis not present

## 2022-02-22 MED ORDER — CYCLOBENZAPRINE HCL 10 MG PO TABS
ORAL_TABLET | ORAL | 0 refills | Status: DC
Start: 2022-02-22 — End: 2023-01-26

## 2022-02-22 MED ORDER — MELOXICAM 15 MG PO TABS: ORAL_TABLET | ORAL | 3 refills | Status: AC

## 2022-02-22 MED ORDER — PREDNISONE 50 MG PO TABS
ORAL_TABLET | ORAL | 0 refills | Status: DC
Start: 2022-02-22 — End: 2022-06-28

## 2022-02-22 NOTE — Progress Notes (Signed)
    Procedures performed today:    None.  Independent interpretation of notes and tests performed by another provider:   None.  Brief History, Exam, Impression, and Recommendations:    Radiculitis of left cervical region Kimberly Gamble returns, she is a very pleasant 51 year old female, I saw her 8 years ago with left-sided C7 radiculopathy. She responded well to prednisone, Mobic, Flexeril and formal PT. More recently she had a recurrence of pain mid cervical spine. No red flag symptoms, we will do the same, updated x-rays, prednisone, Mobic, Flexeril, home physical therapy. Return to see me in 6 weeks, MR for interventional planning if not better.  Primary osteoarthritis of both knees Also having bilateral knee pain, medial joint line, moderate gelling. Adding x-rays, Mobic should be helpful, home physical therapy given, return to see me in 6 weeks, injection if not better.  Chronic process with exacerbation and pharmacologic intervention  ____________________________________________ Gwen Her. Dianah Field, M.D., ABFM., CAQSM., AME. Primary Care and Sports Medicine [facility]  Adjunct Professor of [place] of [facility] [employer]  Risk manager

## 2022-02-22 NOTE — Assessment & Plan Note (Signed)
Also having bilateral knee pain, medial joint line, moderate gelling. Adding x-rays, Mobic should be helpful, home physical therapy given, return to see me in 6 weeks, injection if not better.

## 2022-02-22 NOTE — Assessment & Plan Note (Signed)
Kimberly Gamble returns, she is a very pleasant 51 year old female, I saw her 8 years ago with left-sided C7 radiculopathy. She responded well to prednisone, Mobic, Flexeril and formal PT. More recently she had a recurrence of pain mid cervical spine. No red flag symptoms, we will do the same, updated x-rays, prednisone, Mobic, Flexeril, home physical therapy. Return to see me in 6 weeks, MR for interventional planning if not better.

## 2022-04-05 ENCOUNTER — Ambulatory Visit: Payer: BC Managed Care – PPO | Admitting: Sports Medicine

## 2022-04-08 ENCOUNTER — Ambulatory Visit: Payer: BC Managed Care – PPO | Admitting: Sports Medicine

## 2022-04-08 ENCOUNTER — Ambulatory Visit (INDEPENDENT_AMBULATORY_CARE_PROVIDER_SITE_OTHER): Payer: BC Managed Care – PPO

## 2022-04-08 DIAGNOSIS — M170 Bilateral primary osteoarthritis of knee: Secondary | ICD-10-CM

## 2022-04-08 DIAGNOSIS — M5412 Radiculopathy, cervical region: Secondary | ICD-10-CM

## 2022-04-08 MED ORDER — TRIAMCINOLONE ACETONIDE 40 MG/ML IJ SUSP
40.0000 mg | Freq: Once | INTRAMUSCULAR | Status: AC
  Administered 2022-04-08: 40 mg via INTRAMUSCULAR

## 2022-04-08 NOTE — Assessment & Plan Note (Signed)
Very pleasant 51 year old female, x-ray confirmed bilateral osteoarthritis, improved to some degree with prednisone, Mobic, PT, unfortunately having persistent pain right knee so we will do a right knee injection today. Return to see me in 6 weeks.

## 2022-04-08 NOTE — Progress Notes (Signed)
    Procedures performed today:    Procedure: Real-time Ultrasound Guided injection of the right knee Device: Samsung HS60  Verbal informed consent obtained.  Time-out conducted.  Noted no overlying erythema, induration, or other signs of local infection.  Skin prepped in a sterile fashion.  Local anesthesia: Topical Ethyl chloride.  With sterile technique and under real time ultrasound guidance: mild effusion noted, 1 cc Kenalog 40, 2 cc lidocaine, 2 cc bupivacaine injected easily Completed without difficulty  Advised to call if fevers/chills, erythema, induration, drainage, or persistent bleeding.  Images permanently stored and available for review in PACS.  Impression: Technically successful ultrasound guided injection.  Independent interpretation of notes and tests performed by another provider:   None.  Brief History, Exam, Impression, and Recommendations:    Primary osteoarthritis of both knees Very pleasant 51 year old female, x-ray confirmed bilateral osteoarthritis, improved to some degree with prednisone, Mobic, PT, unfortunately having persistent pain right knee so we will do a right knee injection today. Return to see me in 6 weeks.  Radiculitis of left cervical region Left C7 radiculopathy resolved with PT, Mobic, prednisone, Flexeril, return to see me as needed.    ____________________________________________ Gwen Her. Dianah Field, M.D., ABFM., CAQSM., AME. Primary Care and Sports Medicine [facility]  Adjunct Professor of [place] of [facility] [employer]  Risk manager

## 2022-04-08 NOTE — Assessment & Plan Note (Signed)
Left C7 radiculopathy resolved with PT, Mobic, prednisone, Flexeril, return to see me as needed.

## 2022-04-23 ENCOUNTER — Other Ambulatory Visit: Payer: Self-pay | Admitting: Family Medicine

## 2022-04-23 DIAGNOSIS — I10 Essential (primary) hypertension: Secondary | ICD-10-CM

## 2022-05-18 IMAGING — DX DG FOOT COMPLETE 3+V*L*
3 series · 3 of 3 positions shown · non-contrast
Comparison: None.

CLINICAL DATA: Left foot pain

EXAM:
LEFT FOOT - COMPLETE 3+ VIEW

[foot ap wb]
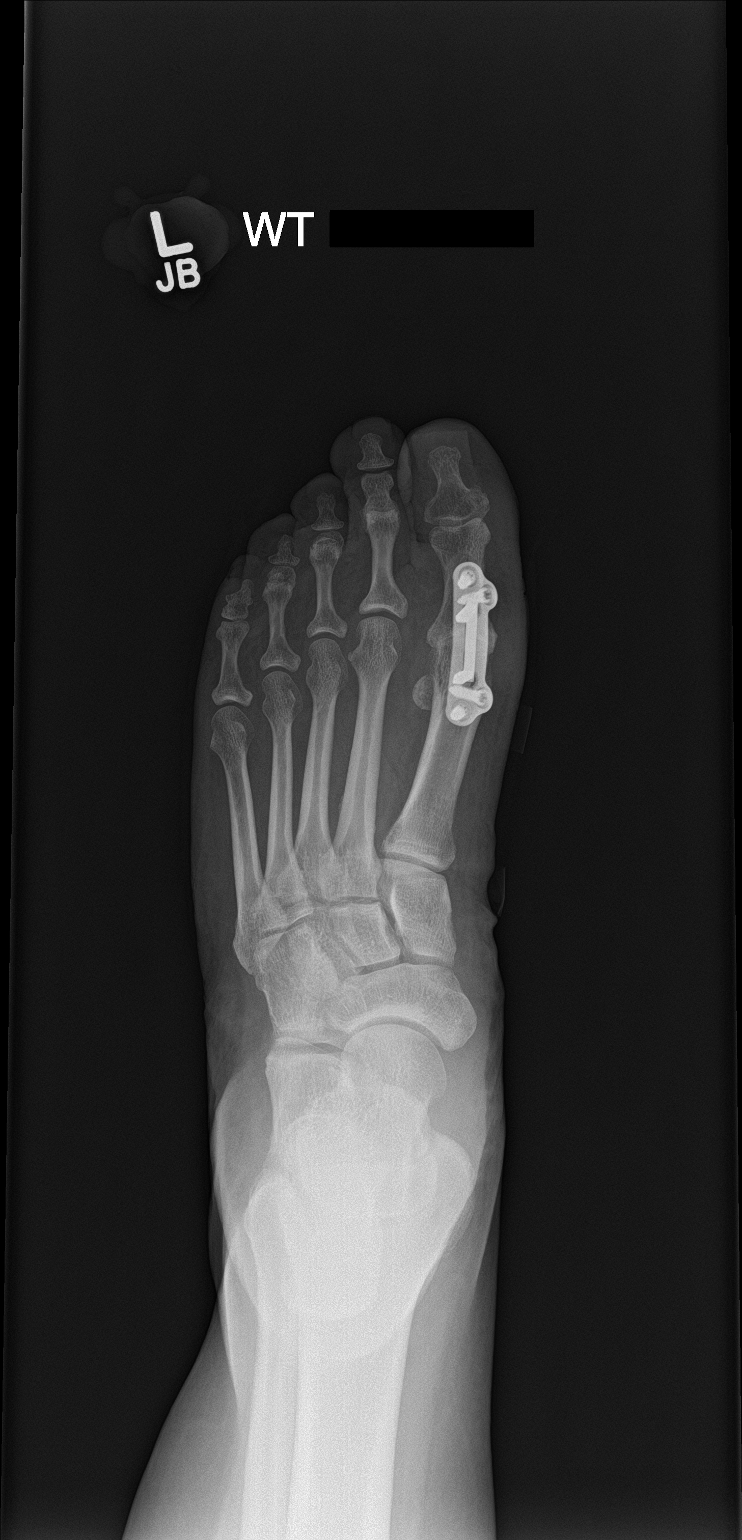

[foot obl wb]
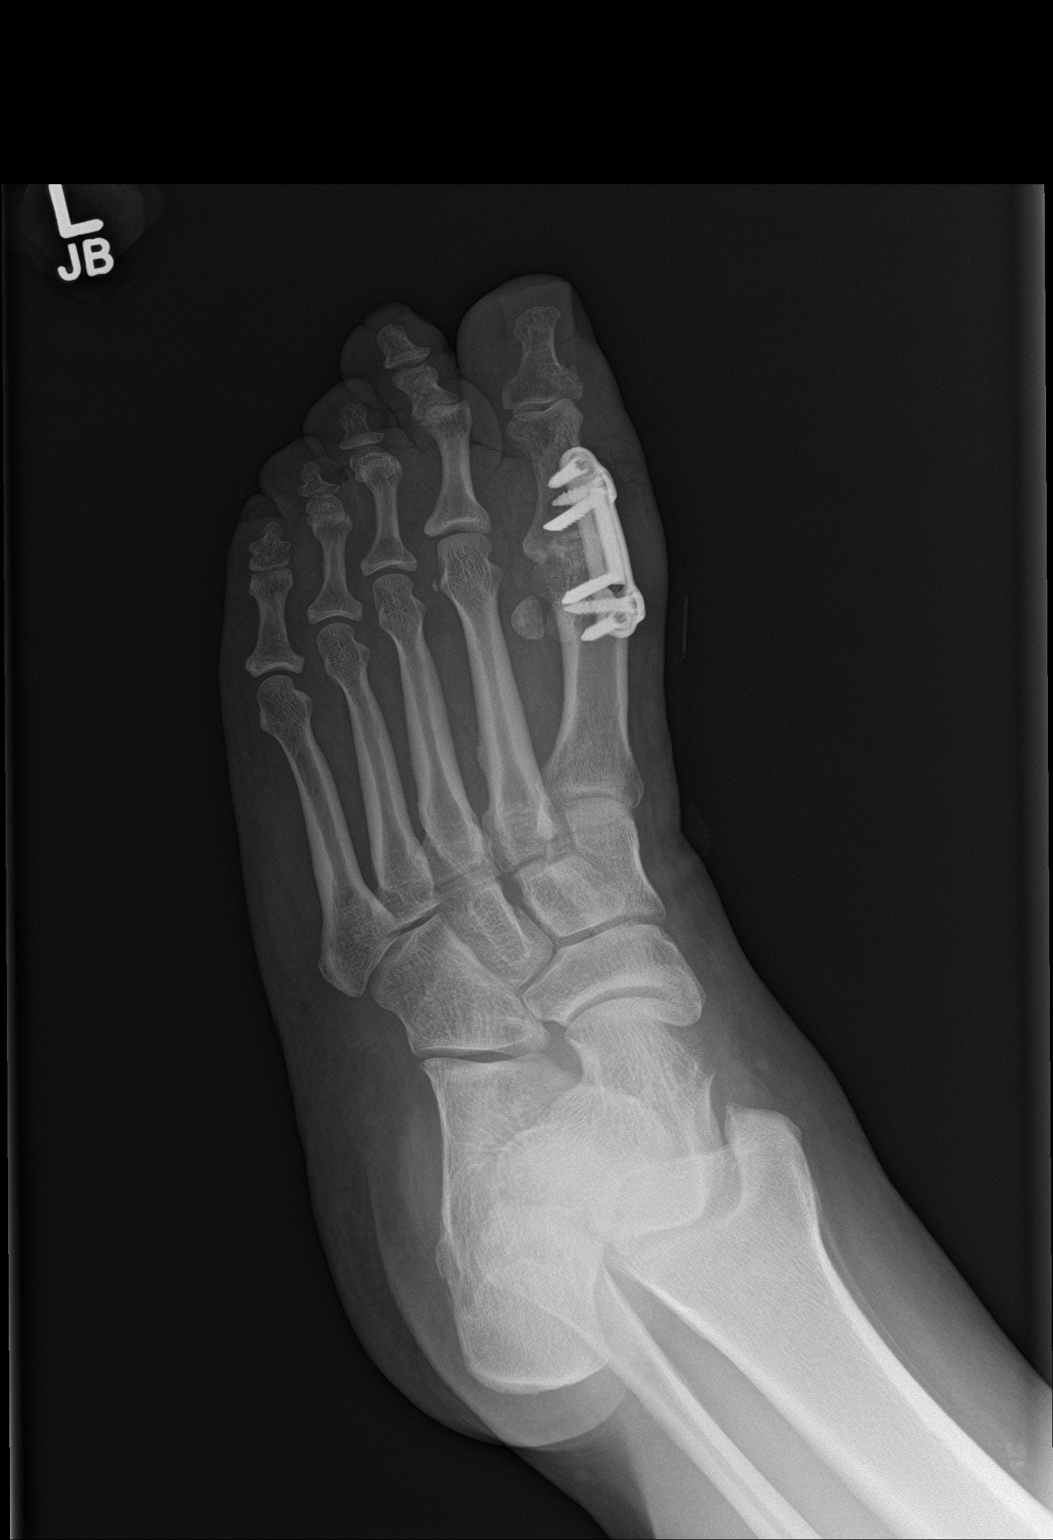

[foot lat wb]
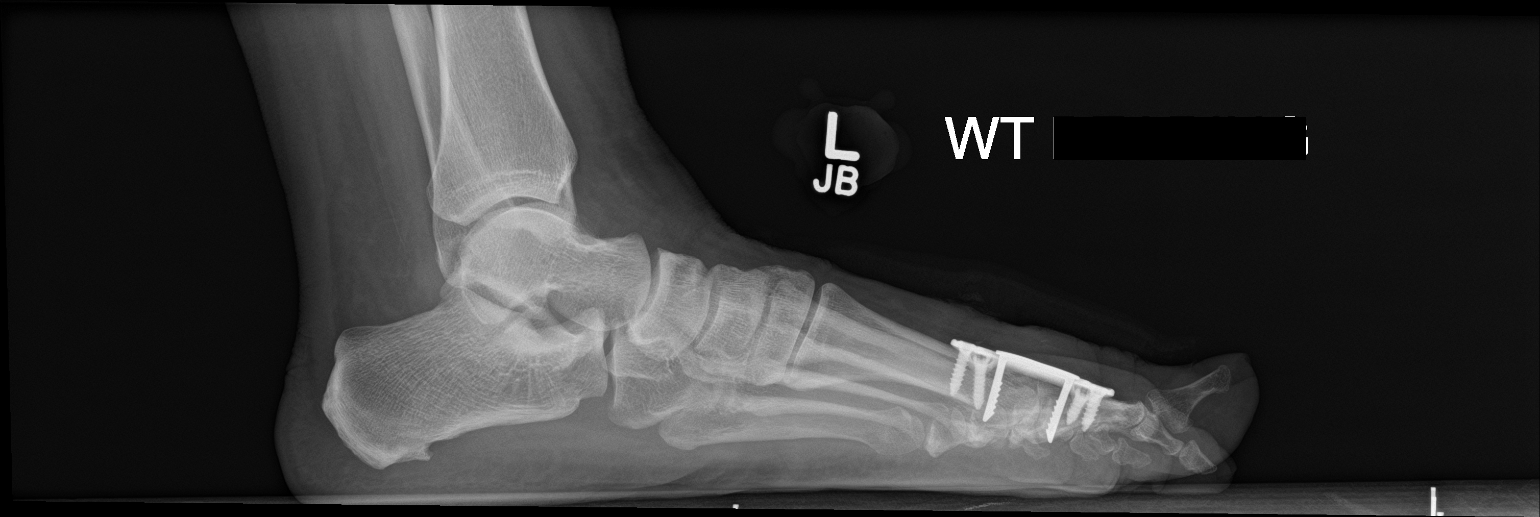

[3 of 3 positions shown; findings below may reference images not displayed]

FINDINGS: Left first MTP arthrodesis has been performed with dorsal
instrumentation. The joint space remains visualized suggesting
incomplete healing at this time. Normal overall alignment. Remaining
joint spaces appear preserved. No acute fracture or dislocation.
Tiny plantar calcaneal spur. Soft tissues are unremarkable.
IMPRESSION: Healing left first MTP arthrodesis with instrumentation.

## 2022-05-20 ENCOUNTER — Ambulatory Visit: Payer: BC Managed Care – PPO | Admitting: Sports Medicine

## 2022-05-27 ENCOUNTER — Ambulatory Visit: Payer: BC Managed Care – PPO | Admitting: Family Medicine

## 2022-06-28 ENCOUNTER — Telehealth: Payer: Self-pay | Admitting: Family Medicine

## 2022-06-28 ENCOUNTER — Ambulatory Visit: Payer: BC Managed Care – PPO | Admitting: Family Medicine

## 2022-06-28 ENCOUNTER — Encounter: Payer: Self-pay | Admitting: Family Medicine

## 2022-06-28 VITALS — BP 135/72 | HR 70 | Ht 65.0 in | Wt 235.1 lb

## 2022-06-28 DIAGNOSIS — I10 Essential (primary) hypertension: Secondary | ICD-10-CM

## 2022-06-28 DIAGNOSIS — D509 Iron deficiency anemia, unspecified: Secondary | ICD-10-CM | POA: Diagnosis not present

## 2022-06-28 DIAGNOSIS — Z1329 Encounter for screening for other suspected endocrine disorder: Secondary | ICD-10-CM

## 2022-06-28 DIAGNOSIS — E785 Hyperlipidemia, unspecified: Secondary | ICD-10-CM

## 2022-06-28 DIAGNOSIS — G4733 Obstructive sleep apnea (adult) (pediatric): Secondary | ICD-10-CM

## 2022-06-28 DIAGNOSIS — M170 Bilateral primary osteoarthritis of knee: Secondary | ICD-10-CM

## 2022-06-28 MED ORDER — LISINOPRIL-HYDROCHLOROTHIAZIDE 10-12.5 MG PO TABS: 1.0000 | ORAL_TABLET | Freq: Every day | ORAL | 1 refills | Status: DC

## 2022-06-28 NOTE — Progress Notes (Signed)
Established Patient Office Visit  Subjective   Patient ID: Kimberly Gamble, female    DOB: 1970/08/07  Age: 52 y.o. MRN: [id number]  Chief Complaint  Patient presents with   Hypertension    HPI  Hypertension- Pt denies chest pain, SOB, dizziness, or heart palpitations.  Taking meds as directed w/o problems.  Denies medication side effects.  She has been having a lot of problems with both of her knees and so has not been able to exercise regularly.  She did get injection in her right knee and says she got great relief for about 3 weeks but now she is having a lot of pain again she is scheduled to see the sports med doc later this week.  She reports that she [facility]  Will call to get that report.  She is due for her 5-year recall for Pap smear.    ROS    Objective:     BP 135/72 (BP Location: Left Arm, Patient Position: Sitting, Cuff Size: Large)   Pulse 70   Ht 5' 5"$  (1.651 m)   Wt 235 lb 0.9 oz (106.6 kg)   LMP 02/07/2019   SpO2 96%   BMI 39.12 kg/m    Physical Exam Vitals and nursing note reviewed.  Constitutional:      Appearance: She is well-developed.  HENT:     Head: Normocephalic and atraumatic.  Cardiovascular:     Rate and Rhythm: Normal rate and regular rhythm.     Heart sounds: Normal heart sounds.  Pulmonary:     Effort: Pulmonary effort is normal.     Breath sounds: Normal breath sounds.  Skin:    General: Skin is warm and dry.  Neurological:     Mental Status: She is alert and oriented to person, place, and time.  Psychiatric:        Behavior: Behavior normal.      Results for orders placed or performed in visit on 06/28/22  HM COLONOSCOPY  Result Value Ref Range   HM Colonoscopy See Report (in chart) See Report (in chart), Patient Reported      The 10-year ASCVD risk score (Arnett DK, et al., 2019) is: 9.2%    Assessment & Plan:   Problem List Items Addressed This Visit        Cardiovascular and Mediastinum   HYPERTENSION, BENIGN    Well controlled. Continue current regimen. Follow up in  6 mo       Relevant Medications   lisinopril-hydrochlorothiazide (ZESTORETIC) 10-12.5 MG tablet   Other Relevant Orders   COMPLETE METABOLIC PANEL WITH GFR   Hemoglobin A1c     Respiratory   OSA (obstructive sleep apnea)    Using it regularly.  Will call for download from the machine from [place].         Musculoskeletal and Integument   Primary osteoarthritis of both knees    Gently her knee pain has been keeping her from being able to exercise we did discuss doing some upper body exercises as well as stationary bike.        Other   Iron deficiency anemia   Relevant Orders   CBC with Differential/Platelet   Hyperlipidemia - Primary   Relevant Medications   lisinopril-hydrochlorothiazide (ZESTORETIC) 10-12.5 MG tablet   Other Relevant Orders   Lipid Panel w/reflex Direct LDL   Hemoglobin A1c   Other Visit Diagnoses     Thyroid disorder screen  Relevant Orders   TSH      Encouraged her  to schedule her Pap smear with GYN down the hall it has been 5 years since she is due.  We will also call to get recent colonoscopy report from [facility].  Return in about 6 months (around 12/27/2022) for Hypertension.     Beatrice Lecher, MD

## 2022-06-28 NOTE — Assessment & Plan Note (Addendum)
Using it regularly.  Will call for download from the machine from [place].

## 2022-06-28 NOTE — Assessment & Plan Note (Signed)
Well controlled. Continue current regimen. Follow up in  6 mo  

## 2022-06-28 NOTE — Patient Instructions (Signed)
Please schedule your Pap smear when you are able to schedule.

## 2022-06-28 NOTE — Assessment & Plan Note (Signed)
Gently her knee pain has been keeping her from being able to exercise we did discuss doing some upper body exercises as well as stationary bike.

## 2022-06-28 NOTE — Telephone Encounter (Signed)
Is call [facility] and get a download report from her CPAP machine so that we can make sure that she is being adequately treated.  I believe she is still set on AutoPap.

## 2022-06-28 NOTE — Telephone Encounter (Signed)
Called [facility] - spoke with representative Maudie Mercury - she states that they only monitor them for 2 years - she will call patient have her bring in the sims card from her device and will be able to download the information this way . ( States patient has  had device for 5 years)

## 2022-06-30 ENCOUNTER — Telehealth: Payer: Self-pay | Admitting: Sports Medicine

## 2022-06-30 ENCOUNTER — Ambulatory Visit (INDEPENDENT_AMBULATORY_CARE_PROVIDER_SITE_OTHER): Payer: BC Managed Care – PPO | Admitting: Sports Medicine

## 2022-06-30 DIAGNOSIS — M170 Bilateral primary osteoarthritis of knee: Secondary | ICD-10-CM

## 2022-06-30 MED ORDER — TRAMADOL HCL 50 MG PO TABS: 50.0000 mg | ORAL_TABLET | Freq: Three times a day (TID) | ORAL | 0 refills | Status: DC | PRN

## 2022-06-30 NOTE — Assessment & Plan Note (Signed)
Pleasant 52 year old female, history of morbid obesity, bilateral right worse than left knee osteoarthritis, we injected her right knee, she returns today she, she only had about 3 weeks of relief. She has x-ray confirmed osteoarthritis and has failed greater than 6 weeks of conservative treatment including home physical therapy. We will proceed with approval for viscosupplementation and I would like to go and get a consultation with orthopedic surgery. Tramadol for pain relief in the meantime, acetaminophen is not effective.

## 2022-06-30 NOTE — Progress Notes (Signed)
    Procedures performed today:    None.  Independent interpretation of notes and tests performed by another provider:   None.  Brief History, Exam, Impression, and Recommendations:    Primary osteoarthritis of both knees Pleasant 52 year old female, history of morbid obesity, bilateral right worse than left knee osteoarthritis, we injected her right knee, she returns today she, she only had about 3 weeks of relief. She has x-ray confirmed osteoarthritis and has failed greater than 6 weeks of conservative treatment including home physical therapy. We will proceed with approval for viscosupplementation and I would like to go and get a consultation with orthopedic surgery. Tramadol for pain relief in the meantime, acetaminophen is not effective.    ____________________________________________ Gwen Her. Dianah Field, M.D., ABFM., CAQSM., AME. Primary Care and Sports Medicine [place] [facility]  Adjunct Professor of [place] of [facility] of Medicine  Risk manager

## 2022-06-30 NOTE — Telephone Encounter (Signed)
Bilateral Orthovisc approval please

## 2022-06-30 NOTE — Telephone Encounter (Signed)
PA information submitted via [email or web address] for Orthovisc Paperwork has been printed and given to Dr. Darene Lamer for signatures. Once obtained, information will be faxed to [employer] at [phone]

## 2022-07-01 LAB — CBC WITH DIFFERENTIAL/PLATELET
Absolute Monocytes: 478 cells/uL (ref 200–950)
Basophils Absolute: 59 cells/uL (ref 0–200)
Basophils Relative: 1 %
Eosinophils Absolute: 100 cells/uL (ref 15–500)
Eosinophils Relative: 1.7 %
HCT: 42.6 % (ref 35.0–45.0)
Hemoglobin: 14.4 g/dL (ref 11.7–15.5)
Lymphs Abs: 2631 cells/uL (ref 850–3900)
MCH: 30.9 pg (ref 27.0–33.0)
MCHC: 33.8 g/dL (ref 32.0–36.0)
MCV: 91.4 fL (ref 80.0–100.0)
MPV: 10.5 fL (ref 7.5–12.5)
Monocytes Relative: 8.1 %
Neutro Abs: 2631 cells/uL (ref 1500–7800)
Neutrophils Relative %: 44.6 %
Platelets: 224 10*3/uL (ref 140–400)
RBC: 4.66 10*6/uL (ref 3.80–5.10)
RDW: 12.5 % (ref 11.0–15.0)
Total Lymphocyte: 44.6 %
WBC: 5.9 10*3/uL (ref 3.8–10.8)

## 2022-07-01 LAB — LIPID PANEL W/REFLEX DIRECT LDL
Cholesterol: 203 mg/dL — ABNORMAL HIGH (ref <200)
HDL: 57 mg/dL (ref >=50)
LDL Cholesterol (Calc): 124 mg/dL (calc) — ABNORMAL HIGH
Non-HDL Cholesterol (Calc): 146 mg/dL (calc) — ABNORMAL HIGH (ref <130)
Total CHOL/HDL Ratio: 3.6 (calc) (ref <5.0)
Triglycerides: 116 mg/dL (ref <150)

## 2022-07-01 LAB — COMPLETE METABOLIC PANEL WITH GFR
AG Ratio: 1.5 (calc) (ref 1.0–2.5)
ALT: 14 U/L (ref 6–29)
AST: 14 U/L (ref 10–35)
Albumin: 4 g/dL (ref 3.6–5.1)
Alkaline phosphatase (APISO): 58 U/L (ref 37–153)
BUN: 16 mg/dL (ref 7–25)
CO2: 31 mmol/L (ref 20–32)
Calcium: 9.8 mg/dL (ref 8.6–10.4)
Chloride: 105 mmol/L (ref 98–110)
Creat: 0.64 mg/dL (ref 0.50–1.03)
Globulin: 2.6 g/dL (calc) (ref 1.9–3.7)
Glucose, Bld: 84 mg/dL (ref 65–99)
Potassium: 4.3 mmol/L (ref 3.5–5.3)
Sodium: 141 mmol/L (ref 135–146)
Total Bilirubin: 0.6 mg/dL (ref 0.2–1.2)
Total Protein: 6.6 g/dL (ref 6.1–8.1)
eGFR: 107 mL/min/{1.73_m2} (ref >=60)

## 2022-07-01 LAB — HEMOGLOBIN A1C
Hgb A1c MFr Bld: 5.4 % of total Hgb (ref <5.7)
Mean Plasma Glucose: 108 mg/dL
eAG (mmol/L): 6 mmol/L

## 2022-07-01 LAB — TSH: TSH: 0.55 mIU/L

## 2022-07-01 NOTE — Progress Notes (Signed)
Hi Kimberly Gamble, LDL cholesterol is looking better.  It really has been trending down over the last couple years so great work.  All other labs look great!  Is a reminder that you are due for your Pap smear.  Please schedule at your convenience or if you see GYN please let us know so that we can get your chart updated.  Thank you so much.

## 2022-07-07 NOTE — Telephone Encounter (Signed)
Benefits Investigation Details received from [employer] Injection: Orthovisc PA required: Yes PA form and medical records faxed to [phone] at 2:52  Fax confirmation received May fill through: [employer] and [place] Copay/Coinsurance: 0% Product Copay: 0% Administration Coinsurance: 0% Administration Copay: $94 Out of Pocket Max: $5900 (met: $50.05)

## 2022-07-09 NOTE — Telephone Encounter (Signed)
Prior Authorization was filed and faxed through [place] will check for updates.

## 2022-07-14 NOTE — Telephone Encounter (Signed)
PA was approved and I notified patient and she was okay with the cost she only has to pay a $94 Copay and states she will call back and get scheduled task complete.

## 2022-12-20 ENCOUNTER — Other Ambulatory Visit: Payer: Self-pay | Admitting: Family Medicine

## 2022-12-20 DIAGNOSIS — Z1231 Encounter for screening mammogram for malignant neoplasm of breast: Secondary | ICD-10-CM

## 2022-12-23 ENCOUNTER — Other Ambulatory Visit: Payer: Self-pay | Admitting: Family Medicine

## 2022-12-23 DIAGNOSIS — I10 Essential (primary) hypertension: Secondary | ICD-10-CM

## 2022-12-27 ENCOUNTER — Ambulatory Visit: Payer: BC Managed Care – PPO | Admitting: Family Medicine

## 2023-01-26 ENCOUNTER — Encounter: Payer: Self-pay | Admitting: Family Medicine

## 2023-01-26 ENCOUNTER — Ambulatory Visit: Payer: BC Managed Care – PPO | Admitting: Family Medicine

## 2023-01-26 VITALS — BP 117/63 | HR 60 | Ht 65.0 in | Wt 244.0 lb

## 2023-01-26 DIAGNOSIS — G5603 Carpal tunnel syndrome, bilateral upper limbs: Secondary | ICD-10-CM

## 2023-01-26 DIAGNOSIS — I8390 Asymptomatic varicose veins of unspecified lower extremity: Secondary | ICD-10-CM

## 2023-01-26 DIAGNOSIS — I10 Essential (primary) hypertension: Secondary | ICD-10-CM

## 2023-01-26 MED ORDER — LISINOPRIL-HYDROCHLOROTHIAZIDE 10-12.5 MG PO TABS
1.0000 | ORAL_TABLET | Freq: Every day | ORAL | 2 refills | Status: AC
Start: 2023-01-26 — End: ?

## 2023-01-26 NOTE — Assessment & Plan Note (Signed)
Well controlled. Continue current regimen. Follow up in  6 mo  

## 2023-01-26 NOTE — Patient Instructions (Signed)
Schedule with Dr. Benjamin Stain here in our office for carpal tunnel.

## 2023-01-26 NOTE — Progress Notes (Signed)
Established Patient Office Visit  Subjective   Patient ID: Kimberly Gamble, female    DOB: [birth year]  Age: 52 y.o. MRN: [id number]    Chief Complaint  Patient presents with   Hypertension    HPI  Hypertension- Pt denies chest pain, SOB, dizziness, or heart palpitations.  Taking meds as directed w/o problems.  Denies medication side effects.    Struggling with her carpal tunnel.  She has been wearing her night splints for at least 3 weeks but she is still getting constant numbness in her left middle finger and it is coming and going more on her right.  She does drive a bus for living.  She does feel like the splints helped some.  But the discomfort is becoming more persistent.  Would also like to consider treatment for her varicose veins.  She does wear compression stockings a few days a week.  And says they get really itchy especially on her posterior lower calf area.    ROS    Objective:     BP 117/63   Pulse 60   Ht 5\' 5"  (1.651 m)   Wt 244 lb (110.7 kg)   LMP 02/07/2019   SpO2 99%   BMI 40.60 kg/m    Physical Exam Vitals and nursing note reviewed.  Constitutional:      Appearance: Normal appearance.  HENT:     Head: Normocephalic and atraumatic.  Eyes:     Conjunctiva/sclera: Conjunctivae normal.  Cardiovascular:     Rate and Rhythm: Normal rate and regular rhythm.  Pulmonary:     Effort: Pulmonary effort is normal.     Breath sounds: Normal breath sounds.  Skin:    General: Skin is warm and dry.  Neurological:     Mental Status: She is alert.  Psychiatric:        Mood and Affect: Mood normal.      No results found for any visits on 01/26/23.    The 10-year ASCVD risk score (Arnett DK, et al., 2019) is: 5.4%    Assessment & Plan:   Problem List Items Addressed This Visit       Cardiovascular and Mediastinum   HYPERTENSION, BENIGN - Primary    Well controlled. Continue current regimen. Follow up in  59mo       Relevant Medications    lisinopril-hydrochlorothiazide (ZESTORETIC) 10-12.5 MG tablet   Other Relevant Orders   CMP14+EGFR   Other Visit Diagnoses     Varicose veins of lower leg       Relevant Medications   lisinopril-hydrochlorothiazide (ZESTORETIC) 10-12.5 MG tablet   Other Relevant Orders   Ambulatory referral to Vascular Surgery   Bilateral carpal tunnel syndrome          Varicose vein's-will refer to vascular clinic.  We also discussed compression stockings.  She actually does wear them a few days a week.  She just does not happen to have them on today.  Return in about 6 months (around 08/02/2023) for Hypertension.    Nani Gasser, MD

## 2023-01-27 LAB — CMP14+EGFR
ALT: 18 IU/L (ref 0–32)
AST: 20 IU/L (ref 0–40)
Albumin: 4.3 g/dL (ref 3.8–4.9)
Alkaline Phosphatase: 75 IU/L (ref 44–121)
BUN/Creatinine Ratio: 19 (ref 9–23)
BUN: 14 mg/dL (ref 6–24)
Bilirubin Total: 0.4 mg/dL (ref 0.0–1.2)
CO2: 25 mmol/L (ref 20–29)
Calcium: 10.3 mg/dL — ABNORMAL HIGH (ref 8.7–10.2)
Chloride: 103 mmol/L (ref 96–106)
Creatinine, Ser: 0.74 mg/dL (ref 0.57–1.00)
Globulin, Total: 2.5 g/dL (ref 1.5–4.5)
Glucose: 79 mg/dL (ref 70–99)
Potassium: 4.2 mmol/L (ref 3.5–5.2)
Sodium: 142 mmol/L (ref 134–144)
Total Protein: 6.8 g/dL (ref 6.0–8.5)
eGFR: 97 mL/min/{1.73_m2} (ref >59)

## 2023-01-27 NOTE — Progress Notes (Signed)
Your lab work is within acceptable range and there are no concerning findings.   ?

## 2023-02-02 ENCOUNTER — Ambulatory Visit: Payer: BC Managed Care – PPO

## 2023-02-02 DIAGNOSIS — Z1231 Encounter for screening mammogram for malignant neoplasm of breast: Secondary | ICD-10-CM

## 2023-02-04 NOTE — Progress Notes (Signed)
Please call patient. Normal mammogram.  Repeat in 1 year.  

## 2023-02-23 ENCOUNTER — Encounter: Payer: Self-pay | Admitting: Obstetrics and Gynecology

## 2023-02-24 ENCOUNTER — Encounter: Payer: BC Managed Care – PPO | Admitting: Obstetrics and Gynecology

## 2023-02-28 ENCOUNTER — Other Ambulatory Visit (INDEPENDENT_AMBULATORY_CARE_PROVIDER_SITE_OTHER): Payer: BC Managed Care – PPO

## 2023-02-28 ENCOUNTER — Ambulatory Visit: Payer: BC Managed Care – PPO | Admitting: Sports Medicine

## 2023-02-28 ENCOUNTER — Encounter: Payer: Self-pay | Admitting: Sports Medicine

## 2023-02-28 DIAGNOSIS — M170 Bilateral primary osteoarthritis of knee: Secondary | ICD-10-CM

## 2023-02-28 DIAGNOSIS — G5603 Carpal tunnel syndrome, bilateral upper limbs: Secondary | ICD-10-CM

## 2023-02-28 MED ORDER — HYALURONAN 30 MG/2ML IX SOSY
30.0000 mg | PREFILLED_SYRINGE | Freq: Once | INTRA_ARTICULAR | Status: AC
Start: 2023-02-28 — End: 2023-02-28
  Administered 2023-02-28: 30 mg via INTRA_ARTICULAR

## 2023-02-28 NOTE — Assessment & Plan Note (Addendum)
Pleasant 52 year old female with bilateral knee osteoarthritis right worse than left, she had 3 weeks of relief from a steroid injection earlier this year. She did get approved for Visco, she would like to start Visco only on the right side today, today we did a right knee Orthovisc injection, return Friday after this 1 in the morning for Orthovisc No. 2 of 4 right knee. She does have a $94 co-pay and so will not be able to come in exactly a week due to when she gets her paycheck, she will have to come in about a week and a half for her next shot.

## 2023-02-28 NOTE — Addendum Note (Signed)
Addended by: Carren Rang A on: 02/28/2023 01:38 PM   Modules accepted: Orders

## 2023-02-28 NOTE — Assessment & Plan Note (Addendum)
Numbness and tingling both hands, worse at night. She does wear her splints at night. Positive Tinel sign bilaterally. No thenar atrophy. Sensation grossly intact. Adding carpal tunnel physical therapy She will work aggressively on cutting out sodium from her diet and we can see her back for this in about 6 weeks and consider bilateral median nerve hydrodissections if not better.

## 2023-02-28 NOTE — Progress Notes (Signed)
    Procedures performed today:    Procedure: Real-time Ultrasound Guided injection of the right knee Device: Samsung HS60  Verbal informed consent obtained.  Time-out conducted.  Noted no overlying erythema, induration, or other signs of local infection.  Skin prepped in a sterile fashion.  Local anesthesia: Topical Ethyl chloride.  With sterile technique and under real time ultrasound guidance: No effusion noted, 30 mg/2 mL of OrthoVisc (sodium hyaluronate) in a prefilled syringe was injected easily into the knee through a 22-gauge needle.   Completed without difficulty  Advised to call if fevers/chills, erythema, induration, drainage, or persistent bleeding.  Images permanently stored and available for review in PACS.  Impression: Technically successful ultrasound guided injection.  Independent interpretation of notes and tests performed by another provider:   None.  Brief History, Exam, Impression, and Recommendations:    Primary osteoarthritis of both knees Pleasant 52 year old female with bilateral knee osteoarthritis right worse than left, she had 3 weeks of relief from a steroid injection earlier this year. She did get approved for Visco, she would like to start Visco only on the right side today, today we did a right knee Orthovisc injection, return Friday after this 1 in the morning for Orthovisc No. 2 of 4 right knee. She does have a $94 co-pay and so will not be able to come in exactly a week due to when she gets her paycheck, she will have to come in about a week and a half for her next shot.  Carpal tunnel syndrome, bilateral Numbness and tingling both hands, worse at night. She does wear her splints at night. Positive Tinel sign bilaterally. No thenar atrophy. Sensation grossly intact. Adding carpal tunnel physical therapy She will work aggressively on cutting out sodium from her diet and we can see her back for this in about 6 weeks and consider bilateral median  nerve hydrodissections if not better.    ____________________________________________ Ihor Austin. Benjamin Stain, M.D., ABFM., CAQSM., AME. Primary Care and Sports Medicine  [facility]  Adjunct Professor of Family Medicine  [place] of [facility] of Medicine  Restaurant manager, fast food

## 2023-03-10 ENCOUNTER — Ambulatory Visit: Payer: BC Managed Care – PPO | Admitting: Sports Medicine

## 2023-03-10 ENCOUNTER — Other Ambulatory Visit (INDEPENDENT_AMBULATORY_CARE_PROVIDER_SITE_OTHER): Payer: BC Managed Care – PPO

## 2023-03-10 DIAGNOSIS — M170 Bilateral primary osteoarthritis of knee: Secondary | ICD-10-CM | POA: Diagnosis not present

## 2023-03-10 MED ORDER — HYALURONAN 30 MG/2ML IX SOSY
30.0000 mg | PREFILLED_SYRINGE | Freq: Once | INTRA_ARTICULAR | Status: AC
Start: 2023-03-10 — End: 2023-03-10
  Administered 2023-03-10: 30 mg via INTRA_ARTICULAR

## 2023-03-10 NOTE — Assessment & Plan Note (Signed)
Orthovisc No. 2 of 4 right knee, return in approximately 1 week for #3 of 4 right knee

## 2023-03-10 NOTE — Addendum Note (Signed)
Addended by: Carren Rang A on: 03/10/2023 01:49 PM   Modules accepted: Orders

## 2023-03-10 NOTE — Progress Notes (Signed)
    Procedures performed today:    Procedure: Real-time Ultrasound Guided injection of the right knee Device: Samsung HS60  Verbal informed consent obtained.  Time-out conducted.  Noted no overlying erythema, induration, or other signs of local infection.  Skin prepped in a sterile fashion.  Local anesthesia: Topical Ethyl chloride.  With sterile technique and under real time ultrasound guidance: No effusion noted, 30 mg/2 mL of OrthoVisc (sodium hyaluronate) in a prefilled syringe was injected easily into the knee through a 22-gauge needle.   Completed without difficulty  Advised to call if fevers/chills, erythema, induration, drainage, or persistent bleeding.  Images permanently stored and available for review in PACS.  Impression: Technically successful ultrasound guided injection.  Independent interpretation of notes and tests performed by another provider:   None.  Brief History, Exam, Impression, and Recommendations:    Primary osteoarthritis of both knees Orthovisc No. 2 of 4 right knee, return in approximately 1 week for #3 of 4 right knee    ____________________________________________ Ihor Austin. Benjamin Stain, M.D., ABFM., CAQSM., AME. Primary Care and Sports Medicine [place] [facility]  Adjunct Professor of Family Medicine  [place] of [facility] of Medicine  Restaurant manager, fast food

## 2023-03-11 ENCOUNTER — Ambulatory Visit: Payer: BC Managed Care – PPO | Admitting: Sports Medicine

## 2023-03-18 ENCOUNTER — Other Ambulatory Visit (INDEPENDENT_AMBULATORY_CARE_PROVIDER_SITE_OTHER): Payer: BC Managed Care – PPO

## 2023-03-18 ENCOUNTER — Ambulatory Visit: Payer: BC Managed Care – PPO | Admitting: Sports Medicine

## 2023-03-18 DIAGNOSIS — M170 Bilateral primary osteoarthritis of knee: Secondary | ICD-10-CM | POA: Diagnosis not present

## 2023-03-18 MED ORDER — HYALURONAN 30 MG/2ML IX SOSY
30.0000 mg | PREFILLED_SYRINGE | Freq: Once | INTRA_ARTICULAR | Status: AC
Start: 2023-03-18 — End: 2023-03-18
  Administered 2023-03-18: 30 mg via INTRA_ARTICULAR

## 2023-03-18 NOTE — Addendum Note (Signed)
Addended by: Carren Rang A on: 03/18/2023 11:08 AM   Modules accepted: Orders

## 2023-03-18 NOTE — Progress Notes (Signed)
    Procedures performed today:    Procedure: Real-time Ultrasound Guided injection of the right knee Device: Samsung HS60  Verbal informed consent obtained.  Time-out conducted.  Noted no overlying erythema, induration, or other signs of local infection.  Skin prepped in a sterile fashion.  Local anesthesia: Topical Ethyl chloride.  With sterile technique and under real time ultrasound guidance: No effusion noted, 30 mg/2 mL of OrthoVisc (sodium hyaluronate) in a prefilled syringe was injected easily into the knee through a 22-gauge needle.   Completed without difficulty  Advised to call if fevers/chills, erythema, induration, drainage, or persistent bleeding.  Images permanently stored and available for review in PACS.  Impression: Technically successful ultrasound guided injection.  Independent interpretation of notes and tests performed by another provider:   None.  Brief History, Exam, Impression, and Recommendations:    Primary osteoarthritis of both knees Orthovisc 3 of 4, return in approximately 1 week for #4 of 4 right knee.    ____________________________________________ Ihor Austin. Benjamin Stain, M.D., ABFM., CAQSM., AME. Primary Care and Sports Medicine [place] [facility]  Adjunct Professor of Family Medicine  [place] of [facility] of Medicine  Restaurant manager, fast food

## 2023-03-18 NOTE — Assessment & Plan Note (Signed)
Orthovisc 3 of 4, return in approximately 1 week for #4 of 4 right knee.

## 2023-03-25 ENCOUNTER — Encounter: Payer: Self-pay | Admitting: Sports Medicine

## 2023-03-25 ENCOUNTER — Other Ambulatory Visit (INDEPENDENT_AMBULATORY_CARE_PROVIDER_SITE_OTHER): Payer: BC Managed Care – PPO

## 2023-03-25 ENCOUNTER — Ambulatory Visit: Payer: BC Managed Care – PPO | Admitting: Sports Medicine

## 2023-03-25 DIAGNOSIS — M170 Bilateral primary osteoarthritis of knee: Secondary | ICD-10-CM

## 2023-03-25 MED ORDER — HYALURONAN 30 MG/2ML IX SOSY
30.0000 mg | PREFILLED_SYRINGE | Freq: Once | INTRA_ARTICULAR | Status: AC
  Administered 2023-03-25: 30 mg via INTRA_ARTICULAR

## 2023-03-25 NOTE — Assessment & Plan Note (Signed)
Orthovisc 4 of 4 right knee

## 2023-03-25 NOTE — Progress Notes (Signed)
    Procedures performed today:    Procedure: Real-time Ultrasound Guided injection of the right knee Device: Samsung HS60  Verbal informed consent obtained.  Time-out conducted.  Noted no overlying erythema, induration, or other signs of local infection.  Skin prepped in a sterile fashion.  Local anesthesia: Topical Ethyl chloride.  With sterile technique and under real time ultrasound guidance: No effusion noted, 30 mg/2 mL of OrthoVisc (sodium hyaluronate) in a prefilled syringe was injected easily into the knee through a 22-gauge needle.   Completed without difficulty  Advised to call if fevers/chills, erythema, induration, drainage, or persistent bleeding.  Images permanently stored and available for review in PACS.  Impression: Technically successful ultrasound guided injection..  Independent interpretation of notes and tests performed by another provider:   None.  Brief History, Exam, Impression, and Recommendations:    Primary osteoarthritis of both knees Orthovisc 4 of 4 right knee    ____________________________________________ Ihor Austin. Benjamin Stain, M.D., ABFM., CAQSM., AME. Primary Care and Sports Medicine [place] [facility]  Adjunct Professor of Family Medicine  [place] [employer] [facility] of Medicine  Restaurant manager, fast food

## 2023-07-26 ENCOUNTER — Ambulatory Visit: Payer: BC Managed Care – PPO | Admitting: Family Medicine

## 2023-10-04 ENCOUNTER — Other Ambulatory Visit: Payer: Self-pay | Admitting: Family Medicine

## 2023-10-04 DIAGNOSIS — I10 Essential (primary) hypertension: Secondary | ICD-10-CM

## 2023-10-20 ENCOUNTER — Encounter: Payer: Self-pay | Admitting: Obstetrics and Gynecology

## 2023-10-20 ENCOUNTER — Ambulatory Visit: Payer: Self-pay | Admitting: Obstetrics and Gynecology

## 2023-10-20 VITALS — BP 136/86 | HR 59 | Ht 65.0 in | Wt 252.0 lb

## 2023-10-20 DIAGNOSIS — L280 Lichen simplex chronicus: Secondary | ICD-10-CM | POA: Diagnosis not present

## 2023-10-20 DIAGNOSIS — Z1331 Encounter for screening for depression: Secondary | ICD-10-CM | POA: Diagnosis not present

## 2023-10-20 DIAGNOSIS — Z01419 Encounter for gynecological examination (general) (routine) without abnormal findings: Secondary | ICD-10-CM

## 2023-10-20 MED ORDER — TRIAMCINOLONE ACETONIDE 0.025 % EX OINT: 1.0000 | TOPICAL_OINTMENT | Freq: Two times a day (BID) | CUTANEOUS | 1 refills | Status: AC

## 2023-10-20 NOTE — Progress Notes (Signed)
   ANNUAL EXAM Patient name: Kimberly Gamble MRN [id number]  Date of birth: November 09, 1970 Chief Complaint:   new gyn  (Last Pap in 2018)  History of Present Illness:   Kimberly Gamble is a 53 y.o. G3P0 female being seen today for a routine annual exam.   Current concerns: Some itching since going through menopause.   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 She has no h/o abnormal paps or surgery on her cervix. She is not sexually active.   Last MXR: 01/2023 Last Pap/Pap History:  No abnormal paps  Review of Systems:   Pertinent items are noted in HPI Denies any headaches, blurred vision, fatigue, shortness of breath, chest pain, abdominal pain, abnormal vaginal discharge/itching/odor/irritation, problems with periods, bowel movements, urination, or intercourse unless otherwise stated above.  Pertinent History Reviewed:  Reviewed past medical,surgical, social and family history.  Reviewed problem list, medications and allergies. Physical Assessment:   Vitals:   10/20/23 1103 10/20/23 1110  BP: (!) 152/82 136/86  Pulse: 65 (!) 59  Weight: 252 lb (114.3 kg)   Height: 5' 5 (1.651 m)   Body mass index is 41.93 kg/m.   Physical Examination:  General appearance - well appearing, and in no distress Mental status - alert, oriented to person, place, and time Psych:  She has a normal mood and affect Skin - warm and dry, normal color, no suspicious lesions noted Chest - effort normal Heart - normal rate  Breasts - breasts appear normal, no suspicious masses, no skin or nipple changes or axillary nodes Abdomen - soft, nontender, nondistended, no masses or organomegaly Pelvic -  Performed and: VULVA: normal appearing vulva with no masses, tenderness or lesions  except some thickening of the skin of the labia majora 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  Assessment & Plan:  Kimberly Gamble was seen today for new gyn .  Diagnoses and all orders for this visit:  Encounter for annual routine gynecological examination - Cervical cancer screening: Discussed guidelines. No longer indicated.  - Breast Health: Encouraged self breast awareness/SBE. Discussed limits of clinical breast exam for detecting breast cancer. Discussed importance of annual MXR. MXR is up to date: 01/2023 - Climacteric/Sexual health: Reviewed typical and atypical symptoms of menopause/peri-menopause. Discussed PMB and to call if any amount of spotting.  - Colonoscopy: Per PCP - F/U 12 months and prn  Lichen simplex Reviewed vulvar hygiene.  -     triamcinolone  (KENALOG ) 0.025 % ointment; Apply 1 Application topically 2 (two) times daily. Twice daily for 2 weeks, nightly for 2 weeks, then twice a week for 4 weeks    No orders of the defined types were placed in this encounter.   Meds:  Meds ordered this encounter  Medications   triamcinolone  (KENALOG ) 0.025 % ointment    Sig: Apply 1 Application topically 2 (two) times daily. Twice daily for 2 weeks, nightly for 2 weeks, then twice a week for 4 weeks    Dispense:  30 g    Refill:  1    Follow-up: No follow-ups on file.  Lacey Pian, MD 10/20/2023 12:51 PM

## 2023-10-24 ENCOUNTER — Telehealth: Payer: Self-pay | Admitting: Family Medicine

## 2023-10-24 ENCOUNTER — Ambulatory Visit (INDEPENDENT_AMBULATORY_CARE_PROVIDER_SITE_OTHER): Admitting: Family Medicine

## 2023-10-24 ENCOUNTER — Encounter: Payer: Self-pay | Admitting: Family Medicine

## 2023-10-24 VITALS — BP 136/89 | HR 73 | Ht 65.0 in | Wt 259.0 lb

## 2023-10-24 DIAGNOSIS — D509 Iron deficiency anemia, unspecified: Secondary | ICD-10-CM

## 2023-10-24 DIAGNOSIS — M170 Bilateral primary osteoarthritis of knee: Secondary | ICD-10-CM

## 2023-10-24 DIAGNOSIS — I10 Essential (primary) hypertension: Secondary | ICD-10-CM | POA: Diagnosis not present

## 2023-10-24 DIAGNOSIS — G4733 Obstructive sleep apnea (adult) (pediatric): Secondary | ICD-10-CM | POA: Diagnosis not present

## 2023-10-24 MED ORDER — LOSARTAN POTASSIUM-HCTZ 50-12.5 MG PO TABS: 1.0000 | ORAL_TABLET | Freq: Every day | ORAL | 2 refills | Status: DC

## 2023-10-24 MED ORDER — AMBULATORY NON FORMULARY MEDICATION
0 refills | Status: AC
Start: 2023-10-24 — End: ?

## 2023-10-24 MED ORDER — LISINOPRIL-HYDROCHLOROTHIAZIDE 10-12.5 MG PO TABS: 1.0000 | ORAL_TABLET | Freq: Every day | ORAL | 2 refills | Status: DC

## 2023-10-24 NOTE — Progress Notes (Signed)
 Established Patient Office Visit  Subjective  Patient ID: Kimberly Gamble, female    DOB: 10-25-1970  Age: 53 y.o. MRN: [id number]  Chief Complaint  Patient presents with   Hypertension    24-month follow up; denies any main concerns for today's visit.    HPI  Hypertension- Pt denies chest pain, SOB, dizziness, or heart palpitations.  Taking meds as directed w/o problems.  Denies medication side effects.    She has a history of obstructive sleep apnea and uses her CPAP regularly she has had her current machine for about 5 to 6 years but the machine started making some abnormal noises and she reached out to [facility] where she gets her supplies and they said that it is malfunctioning and needs a new machine.   Did see the vein and vascular specialist as well.  She had consultation at the [facility].  She is, having some upcoming procedures in regards to her veins.  Would like an orthopedic referral for her left knee in particular.  She had injections and said she got some pain relief for about 3 months but now she feels like her knee is getting worse and is interested in speaking with a Careers adviser.    ROS    Objective:     BP 136/89   Pulse 73   Ht 5' 5 (1.651 m)   Wt 259 lb (117.5 kg)   LMP 02/07/2019   SpO2 98%   BMI 43.10 kg/m    Physical Exam Vitals and nursing note reviewed.  Constitutional:      Appearance: Normal appearance.  HENT:     Head: Normocephalic and atraumatic.   Eyes:     Conjunctiva/sclera: Conjunctivae normal.    Cardiovascular:     Rate and Rhythm: Normal rate and regular rhythm.  Pulmonary:     Effort: Pulmonary effort is normal.     Breath sounds: Normal breath sounds.   Skin:    General: Skin is warm and dry.   Neurological:     Mental Status: She is alert.   Psychiatric:        Mood and Affect: Mood normal.      No results found for any visits on 10/24/23.    The 10-year ASCVD risk score (Arnett DK, et  al., 2019) is: 10.1%    Assessment & Plan:   Problem List Items Addressed This Visit       Cardiovascular and Mediastinum   HYPERTENSION, BENIGN   Blood pressure looks better today. Will change ACE to ARB for less risk of Angioedema.  F?u in 6 months. Due for labs.       Relevant Medications   losartan-hydrochlorothiazide  (HYZAAR) 50-12.5 MG tablet   Other Relevant Orders   CMP14+EGFR   Lipid Panel With LDL/HDL Ratio   CBC with Differential     Respiratory   OSA (obstructive sleep apnea)   Ahead and order a new CPAP with supplies she currently uses nasal pillows and uses her machine nightly.  She notices a big improvement in her daytime sleepiness and feels well rested when she uses it.      Relevant Medications   AMBULATORY NON FORMULARY MEDICATION     Musculoskeletal and Integument   Primary osteoarthritis of both knees - Primary   Relevant Orders   Ambulatory referral to Orthopedic Surgery     Other   Iron deficiency anemia   Of iron deficiency and low ferritin.  Last ferritin was still  not at goal we will plan to recheck again today.  She is not currently taking any iron but she is taking a multivitamin.      Relevant Orders   Iron, TIBC and Ferritin Panel    Return in about 6 months (around 04/24/2024) for Hypertension.    Duaine German, MD

## 2023-10-24 NOTE — Assessment & Plan Note (Signed)
 Blood pressure looks better today. Will change ACE to ARB for less risk of Angioedema.  F?u in 6 months. Due for labs.

## 2023-10-24 NOTE — Assessment & Plan Note (Signed)
 Ahead and order a new CPAP with supplies she currently uses nasal pillows and uses her machine nightly.  She notices a big improvement in her daytime sleepiness and feels well rested when she uses it.

## 2023-10-24 NOTE — Assessment & Plan Note (Signed)
 Of iron deficiency and low ferritin.  Last ferritin was still not at goal we will plan to recheck again today.  She is not currently taking any iron but she is taking a multivitamin.

## 2023-10-24 NOTE — Telephone Encounter (Signed)
 Call patient and let her know that I got a go ahead and change her blood pressure medication to something very similar to what she is currently taking but that has less risk for angioedema which is swelling of the mouth and lips and tongue that can happen with certain blood pressure pills is just a little bit safer option but should work just as effectively to lower her blood pressure and it still once a day, and it still generic.

## 2023-10-25 ENCOUNTER — Ambulatory Visit: Payer: Self-pay | Admitting: Family Medicine

## 2023-10-25 LAB — CMP14+EGFR
ALT: 20 IU/L (ref 0–32)
AST: 17 IU/L (ref 0–40)
Albumin: 4 g/dL (ref 3.8–4.9)
Alkaline Phosphatase: 85 IU/L (ref 44–121)
BUN/Creatinine Ratio: 17 (ref 9–23)
BUN: 14 mg/dL (ref 6–24)
Bilirubin Total: 0.3 mg/dL (ref 0.0–1.2)
CO2: 22 mmol/L (ref 20–29)
Calcium: 9.8 mg/dL (ref 8.7–10.2)
Chloride: 107 mmol/L — ABNORMAL HIGH (ref 96–106)
Creatinine, Ser: 0.81 mg/dL (ref 0.57–1.00)
Globulin, Total: 2.3 g/dL (ref 1.5–4.5)
Glucose: 83 mg/dL (ref 70–99)
Potassium: 4.3 mmol/L (ref 3.5–5.2)
Sodium: 141 mmol/L (ref 134–144)
Total Protein: 6.3 g/dL (ref 6.0–8.5)
eGFR: 87 mL/min/{1.73_m2} (ref >59)

## 2023-10-25 LAB — LIPID PANEL WITH LDL/HDL RATIO
Cholesterol, Total: 206 mg/dL — ABNORMAL HIGH (ref 100–199)
HDL: 51 mg/dL (ref >39)
LDL Chol Calc (NIH): 131 mg/dL — ABNORMAL HIGH (ref 0–99)
LDL/HDL Ratio: 2.6 ratio (ref 0.0–3.2)
Triglycerides: 137 mg/dL (ref 0–149)
VLDL Cholesterol Cal: 24 mg/dL (ref 5–40)

## 2023-10-25 LAB — CBC WITH DIFFERENTIAL/PLATELET
Basophils Absolute: 0.1 10*3/uL (ref 0.0–0.2)
Basos: 1 %
EOS (ABSOLUTE): 0.1 10*3/uL (ref 0.0–0.4)
Eos: 2 %
Hematocrit: 39.7 % (ref 34.0–46.6)
Hemoglobin: 13.4 g/dL (ref 11.1–15.9)
Immature Grans (Abs): 0 10*3/uL (ref 0.0–0.1)
Immature Granulocytes: 0 %
Lymphocytes Absolute: 2.4 10*3/uL (ref 0.7–3.1)
Lymphs: 44 %
MCH: 31.5 pg (ref 26.6–33.0)
MCHC: 33.8 g/dL (ref 31.5–35.7)
MCV: 93 fL (ref 79–97)
Monocytes Absolute: 0.5 10*3/uL (ref 0.1–0.9)
Monocytes: 8 %
Neutrophils Absolute: 2.4 10*3/uL (ref 1.4–7.0)
Neutrophils: 45 %
Platelets: 201 10*3/uL (ref 150–450)
RBC: 4.25 x10E6/uL (ref 3.77–5.28)
RDW: 12.8 % (ref 11.7–15.4)
WBC: 5.5 10*3/uL (ref 3.4–10.8)

## 2023-10-25 LAB — IRON,TIBC AND FERRITIN PANEL
Ferritin: 129 ng/mL (ref 15–150)
Iron Saturation: 27 % (ref 15–55)
Iron: 70 ug/dL (ref 27–159)
Total Iron Binding Capacity: 262 ug/dL (ref 250–450)
UIBC: 192 ug/dL (ref 131–425)

## 2023-10-25 NOTE — Telephone Encounter (Signed)
 This request has been handled. Patient has been updated of the provider's note and new medication. Verbalized understanding. Patient confirmed she has the new medication on hand. No further action is required.

## 2023-10-25 NOTE — Progress Notes (Signed)
 HI Kimberly Gamble,  Your metabolic panel overall looks good.  Total cholesterol and LDL are elevated.  Just continue to work on healthy diet and regular exercise to bring this down.  Blood count looks great.  No sign of anemia.  Your ferritin levels/iron look great!  So that is wonderful as well.

## 2023-11-03 ENCOUNTER — Telehealth: Payer: Self-pay

## 2023-11-03 NOTE — Telephone Encounter (Signed)
 Copied from CRM [id number]. Topic: Clinical - Medical Advice >> Nov 03, 2023 11:36 AM Zane F wrote: Reason for CRM:   Caller Name: Leotis   Calling From?: [facility]    Received an order for the patient CPAP machine and they are missing sleep study and chart notes; They are asking for this information to be sent over via fax.   Contact Information:   Phone: [phone] Fax: [phone]

## 2023-11-04 NOTE — Telephone Encounter (Signed)
 Bascom can you check into this and see if we can find her original sleep study.  If not we may have to call her and find out where she went.

## 2023-11-04 NOTE — Telephone Encounter (Signed)
 The only referral I have for this patient is orthopedic surgery , no sleep study.

## 2023-11-09 ENCOUNTER — Telehealth: Payer: Self-pay | Admitting: Family Medicine

## 2023-11-09 NOTE — Telephone Encounter (Signed)
 Copied from CRM [id number]. Topic: General - Other >> Nov 09, 2023 10:16 AM Zane F wrote: Reason for CRM:    Caller: Brandy  Calling From: [facility] at [facility]  Reason: Calling on behalf of the patient's CPAP order they received. The order is missing chart notes prior to the sleep study; They need this information in order to proceed with processing the order. Please fax over documentation using information provided.  Deadline: As soon as possible  Contact Information:  Phone: [phone]  Fax Number: [phone]

## 2023-11-10 NOTE — Telephone Encounter (Signed)
 CPAP machine orders, sleep study results, clinical notes, demographics, insurance cards and copy of Photo ID has been faxed to [facility] [place]) at [phone].

## 2023-11-15 NOTE — Telephone Encounter (Signed)
 This has been done.

## 2024-01-10 ENCOUNTER — Encounter: Payer: Self-pay | Admitting: Sports Medicine

## 2024-04-24 ENCOUNTER — Ambulatory Visit: Admitting: Family Medicine

## 2024-04-24 ENCOUNTER — Encounter: Payer: Self-pay | Admitting: Family Medicine

## 2024-04-24 VITALS — BP 126/78 | HR 72 | Ht 65.0 in | Wt 260.0 lb

## 2024-04-24 DIAGNOSIS — M170 Bilateral primary osteoarthritis of knee: Secondary | ICD-10-CM

## 2024-04-24 DIAGNOSIS — I10 Essential (primary) hypertension: Secondary | ICD-10-CM

## 2024-04-24 DIAGNOSIS — E785 Hyperlipidemia, unspecified: Secondary | ICD-10-CM

## 2024-04-24 MED ORDER — LOSARTAN POTASSIUM 100 MG PO TABS: 100.0000 mg | ORAL_TABLET | Freq: Every day | ORAL | 2 refills | Status: AC

## 2024-04-24 NOTE — Progress Notes (Signed)
 Established Patient Office Visit  Patient ID: Kimberly Gamble, female    DOB: 12/19/1970  Age: 53 y.o. MRN: [id number] PCP: Alvan Dorothyann JONETTA, MD  Chief Complaint  Patient presents with   Hypertension    Subjective:     HPI  Discussed the use of AI scribe software for clinical note transcription with the patient, who gave verbal consent to proceed.  History of Present Illness Kimberly Gamble is a 53 year old female with hypertension who presents for medication management and follow-up.  Hypertension - Home blood pressure readings average around 130 mmHg. - Blood pressure readings tend to decrease slightly in the clinic. - Adheres to a low-salt diet. - No chest discomfort, palpitations, or peripheral edema.  Diuretic-associated urinary frequency - Currently taking losartan  HCT, which contains a diuretic component. - Experiences increased urinary frequency, particularly in the mornings. - Frequent urination is inconvenient due to work schedule. - No episodes of incontinence.  Hyperlipidemia - Cholesterol was slightly elevated on last blood work in June. - No dietary modifications made since last visit.  Knee osteoarthritis - Receiving treatment for knee arthritis. - Gel injections provided relief for approximately five months. - Cost of gel injections is $400 out of pocket per injection; insurance covers only one injection per year. - Steroid injections provided relief for three weeks.     ROS    Objective:     BP 126/78   Pulse 72   Ht 5' 5 (1.651 m)   Wt 260 lb (117.9 kg)   LMP 01/26/2019   SpO2 95%   BMI 43.27 kg/m    Physical Exam Vitals and nursing note reviewed.  Constitutional:      Appearance: Normal appearance.  HENT:     Head: Normocephalic and atraumatic.  Eyes:     Conjunctiva/sclera: Conjunctivae normal.  Cardiovascular:     Rate and Rhythm: Normal rate and regular rhythm.  Pulmonary:     Effort: Pulmonary effort is  normal.     Breath sounds: Normal breath sounds.  Skin:    General: Skin is warm and dry.  Neurological:     Mental Status: She is alert.  Psychiatric:        Mood and Affect: Mood normal.      No results found for any visits on 04/24/24.    The 10-year ASCVD risk score (Arnett DK, et al., 2019) is: 8.8%    Assessment & Plan:   Problem List Items Addressed This Visit       Cardiovascular and Mediastinum   HYPERTENSION, BENIGN - Primary   Relevant Medications   losartan  (COZAAR ) 100 MG tablet   Other Relevant Orders   CMP14+EGFR   Hemoglobin A1c     Musculoskeletal and Integument   Primary osteoarthritis of both knees     Other   Hyperlipidemia   Relevant Medications   losartan  (COZAAR ) 100 MG tablet   Other Relevant Orders   CMP14+EGFR   Hemoglobin A1c    Assessment and Plan Assessment & Plan Essential hypertension Blood pressure at 130 mmHg, slightly above target. No symptoms of chest discomfort, palpitations, or edema. Current medication includes losartan  HCT. 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 - Increased losartan  dosage. - Monitor blood pressure for one month.  Hyperlipidemia Cholesterol levels elevated in June. No dietary adjustments made. - Ordered blood work to assess cholesterol levels. - Advised dietary modifications: increase vegetables, lean proteins, use  olive oil.  Knee osteoarthritis Knee pain due to osteoarthritis. Previous treatments include steroid and gel injections with limited relief. Orthopedic consultation suggested she is too young for surgery. Discussed potential for joint replacement. - Consider appealing to insurance for additional gel injections if needed.  General health maintenance Has not received flu shot this year. Discussed pneumonia vaccine for those 50 and over. - Consider receiving a flu shot. - Consider receiving a pneumonia  vaccine.    Return in about 6 months (around 10/23/2024) for Hypertension.    Dorothyann Byars, MD [facility] at [address]

## 2024-04-25 ENCOUNTER — Ambulatory Visit: Payer: Self-pay | Admitting: Family Medicine

## 2024-04-25 LAB — CMP14+EGFR
ALT: 13 IU/L (ref 0–32)
AST: 15 IU/L (ref 0–40)
Albumin: 4.3 g/dL (ref 3.8–4.9)
Alkaline Phosphatase: 87 IU/L (ref 49–135)
BUN/Creatinine Ratio: 23 (ref 9–23)
BUN: 17 mg/dL (ref 6–24)
Bilirubin Total: 0.5 mg/dL (ref 0.0–1.2)
CO2: 25 mmol/L (ref 20–29)
Calcium: 10.1 mg/dL (ref 8.7–10.2)
Chloride: 102 mmol/L (ref 96–106)
Creatinine, Ser: 0.75 mg/dL (ref 0.57–1.00)
Globulin, Total: 2.3 g/dL (ref 1.5–4.5)
Glucose: 75 mg/dL (ref 70–99)
Potassium: 4.1 mmol/L (ref 3.5–5.2)
Sodium: 141 mmol/L (ref 134–144)
Total Protein: 6.6 g/dL (ref 6.0–8.5)
eGFR: 95 mL/min/1.73 (ref >59)

## 2024-04-25 LAB — HEMOGLOBIN A1C
Est. average glucose Bld gHb Est-mCnc: 111 mg/dL
Hgb A1c MFr Bld: 5.5 % (ref 4.8–5.6)

## 2024-04-25 NOTE — Progress Notes (Signed)
 Your lab work is within acceptable range and there are no concerning findings.   ?

## 2024-06-13 ENCOUNTER — Telehealth: Payer: Self-pay | Admitting: Family Medicine

## 2024-06-13 NOTE — Telephone Encounter (Signed)
 Copied from CRM [id number]. Topic: General - Call Back - No Documentation >> Jun 13, 2024 11:29 AM Avram MATSU wrote: Reason for CRM: Called to find out more information regarding patient treatment plan  Fax:[phone] attention to [facility]

## 2024-06-14 NOTE — Telephone Encounter (Signed)
 Called Kimberly Gamble and LVM asking that she return call regarding the treatment plan that she is inquiring us  about that she needed faxed to her.

## 2024-06-15 NOTE — Telephone Encounter (Signed)
 Requested information faxed

## 2024-10-23 ENCOUNTER — Ambulatory Visit: Admitting: Family Medicine
